# Patient Record
Sex: Female | Born: 1995 | Race: White | Hispanic: No | Marital: Single | Smoking: Never smoker
Health system: Southern US, Community
[De-identification: ages and names within clinical notes are randomized; demographics above are authoritative.]

## PROBLEM LIST (undated history)

## (undated) DIAGNOSIS — R011 Cardiac murmur, unspecified: Secondary | ICD-10-CM

## (undated) DIAGNOSIS — A64 Unspecified sexually transmitted disease: Secondary | ICD-10-CM

## (undated) DIAGNOSIS — D369 Benign neoplasm, unspecified site: Secondary | ICD-10-CM

## (undated) DIAGNOSIS — F419 Anxiety disorder, unspecified: Secondary | ICD-10-CM

## (undated) DIAGNOSIS — N946 Dysmenorrhea, unspecified: Secondary | ICD-10-CM

## (undated) DIAGNOSIS — E282 Polycystic ovarian syndrome: Secondary | ICD-10-CM

## (undated) HISTORY — DX: Anxiety disorder, unspecified: F41.9

## (undated) HISTORY — DX: Unspecified sexually transmitted disease: A64

## (undated) HISTORY — DX: Cardiac murmur, unspecified: R01.1

## (undated) HISTORY — DX: Polycystic ovarian syndrome: E28.2

## (undated) HISTORY — DX: Dysmenorrhea, unspecified: N94.6

## (undated) HISTORY — DX: Benign neoplasm, unspecified site: D36.9

---
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
## 2017-04-23 ENCOUNTER — Ambulatory Visit: Payer: Commercial Managed Care - PPO | Admitting: Obstetrics and Gynecology

## 2017-04-25 ENCOUNTER — Encounter: Payer: Self-pay | Admitting: Obstetrics and Gynecology

## 2017-04-25 ENCOUNTER — Ambulatory Visit (INDEPENDENT_AMBULATORY_CARE_PROVIDER_SITE_OTHER): Payer: Commercial Managed Care - PPO | Admitting: Obstetrics and Gynecology

## 2017-04-25 ENCOUNTER — Other Ambulatory Visit: Payer: Self-pay

## 2017-04-25 VITALS — BP 110/60 | HR 80 | Resp 14 | Ht 63.5 in | Wt 155.0 lb

## 2017-04-25 DIAGNOSIS — Z0000 Encounter for general adult medical examination without abnormal findings: Secondary | ICD-10-CM

## 2017-04-25 DIAGNOSIS — R7989 Other specified abnormal findings of blood chemistry: Secondary | ICD-10-CM

## 2017-04-25 DIAGNOSIS — Z113 Encounter for screening for infections with a predominantly sexual mode of transmission: Secondary | ICD-10-CM

## 2017-04-25 DIAGNOSIS — N926 Irregular menstruation, unspecified: Secondary | ICD-10-CM

## 2017-04-25 DIAGNOSIS — Z01419 Encounter for gynecological examination (general) (routine) without abnormal findings: Secondary | ICD-10-CM

## 2017-04-25 DIAGNOSIS — R945 Abnormal results of liver function studies: Secondary | ICD-10-CM | POA: Diagnosis not present

## 2017-04-25 LAB — POCT URINE PREGNANCY: PREG TEST UR: NEGATIVE

## 2017-04-25 NOTE — Patient Instructions (Addendum)
Call if you don't get your cycle in the next few weeks  EXERCISE AND DIET:  We recommended that you start or continue a regular exercise program for good health. Regular exercise means any activity that makes your heart beat faster and makes you sweat.  We recommend exercising at least 30 minutes per day at least 3 days a week, preferably 4 or 5.  We also recommend a diet low in fat and sugar.  Inactivity, poor dietary choices and obesity can cause diabetes, heart attack, stroke, and kidney damage, among others.    ALCOHOL AND SMOKING:  Women should limit their alcohol intake to no more than 7 drinks/beers/glasses of wine (combined, not each!) per week. Moderation of alcohol intake to this level decreases your risk of breast cancer and liver damage. And of course, no recreational drugs are part of a healthy lifestyle.  And absolutely no smoking or even second hand smoke. Most people know smoking can cause heart and lung diseases, but did you know it also contributes to weakening of your bones? Aging of your skin?  Yellowing of your teeth and nails?  CALCIUM AND VITAMIN D:  Adequate intake of calcium and Vitamin D are recommended.  The recommendations for exact amounts of these supplements seem to change often, but generally speaking 600 mg of calcium (either carbonate or citrate) and 800 units of Vitamin D per day seems prudent. Certain women may benefit from higher intake of Vitamin D.  If you are among these women, your doctor will have told you during your visit.    PAP SMEARS:  Pap smears, to check for cervical cancer or precancers,  have traditionally been done yearly, although recent scientific advances have shown that most women can have pap smears less often.  However, every woman still should have a physical exam from her gynecologist every year. It will include a breast check, inspection of the vulva and vagina to check for abnormal growths or skin changes, a visual exam of the cervix, and then an  exam to evaluate the size and shape of the uterus and ovaries.  And after 20 years of age, a rectal exam is indicated to check for rectal cancers. We will also provide age appropriate advice regarding health maintenance, like when you should have certain vaccines, screening for sexually transmitted diseases, bone density testing, colonoscopy, mammograms, etc.   MAMMOGRAMS:  All women over 46 years old should have a yearly mammogram. Many facilities now offer a "3D" mammogram, which may cost around $50 extra out of pocket. If possible,  we recommend you accept the option to have the 3D mammogram performed.  It both reduces the number of women who will be called back for extra views which then turn out to be normal, and it is better than the routine mammogram at detecting truly abnormal areas.    COLONOSCOPY:  Colonoscopy to screen for colon cancer is recommended for all women at age 86.  We know, you hate the idea of the prep.  We agree, BUT, having colon cancer and not knowing it is worse!!  Colon cancer so often starts as a polyp that can be seen and removed at colonscopy, which can quite literally save your life!  And if your first colonoscopy is normal and you have no family history of colon cancer, most women don't have to have it again for 10 years.  Once every ten years, you can do something that may end up saving your life, right?  We  will be happy to help you get it scheduled when you are ready.  Be sure to check your insurance coverage so you understand how much it will cost.  It may be covered as a preventative service at no cost, but you should check your particular policy.      Breast Self-Awareness Breast self-awareness means being familiar with how your breasts look and feel. It involves checking your breasts regularly and reporting any changes to your health care provider. Practicing breast self-awareness is important. A change in your breasts can be a sign of a serious medical problem.  Being familiar with how your breasts look and feel allows you to find any problems early, when treatment is more likely to be successful. All women should practice breast self-awareness, including women who have had breast implants. How to do a breast self-exam One way to learn what is normal for your breasts and whether your breasts are changing is to do a breast self-exam. To do a breast self-exam: Look for Changes  1. Remove all the clothing above your waist. 2. Stand in front of a mirror in a room with good lighting. 3. Put your hands on your hips. 4. Push your hands firmly downward. 5. Compare your breasts in the mirror. Look for differences between them (asymmetry), such as: ? Differences in shape. ? Differences in size. ? Puckers, dips, and bumps in one breast and not the other. 6. Look at each breast for changes in your skin, such as: ? Redness. ? Scaly areas. 7. Look for changes in your nipples, such as: ? Discharge. ? Bleeding. ? Dimpling. ? Redness. ? A change in position. Feel for Changes  Carefully feel your breasts for lumps and changes. It is best to do this while lying on your back on the floor and again while sitting or standing in the shower or tub with soapy water on your skin. Feel each breast in the following way:  Place the arm on the side of the breast you are examining above your head.  Feel your breast with the other hand.  Start in the nipple area and make  inch (2 cm) overlapping circles to feel your breast. Use the pads of your three middle fingers to do this. Apply light pressure, then medium pressure, then firm pressure. The light pressure will allow you to feel the tissue closest to the skin. The medium pressure will allow you to feel the tissue that is a little deeper. The firm pressure will allow you to feel the tissue close to the ribs.  Continue the overlapping circles, moving downward over the breast until you feel your ribs below your  breast.  Move one finger-width toward the center of the body. Continue to use the  inch (2 cm) overlapping circles to feel your breast as you move slowly up toward your collarbone.  Continue the up and down exam using all three pressures until you reach your armpit.  Write Down What You Find  Write down what is normal for each breast and any changes that you find. Keep a written record with breast changes or normal findings for each breast. By writing this information down, you do not need to depend only on memory for size, tenderness, or location. Write down where you are in your menstrual cycle, if you are still menstruating. If you are having trouble noticing differences in your breasts, do not get discouraged. With time you will become more familiar with the variations in your breasts  and more comfortable with the exam. How often should I examine my breasts? Examine your breasts every month. If you are breastfeeding, the best time to examine your breasts is after a feeding or after using a breast pump. If you menstruate, the best time to examine your breasts is 5-7 days after your period is over. During your period, your breasts are lumpier, and it may be more difficult to notice changes. When should I see my health care provider? See your health care provider if you notice:  A change in shape or size of your breasts or nipples.  A change in the skin of your breast or nipples, such as a reddened or scaly area.  Unusual discharge from your nipples.  A lump or thick area that was not there before.  Pain in your breasts.  Anything that concerns you.  This information is not intended to replace advice given to you by your health care provider. Make sure you discuss any questions you have with your health care provider. Document Released: 12/19/2004 Document Revised: 05/27/2015 Document Reviewed: 11/08/2014 Elsevier Interactive Patient Education  [employer].

## 2017-04-25 NOTE — Progress Notes (Signed)
22 y.o. G1P0010 SingleAfrican AmericanF here for annual exam.  She stopped OCP's last October, just got tired of taking the pill. She is sexually active, same partner x 3 years. Living together.  Sometimes uses condoms. No dyspareunia.  Period Cycle (Days): 28 Period Duration (Days): 2-3 days  Period Pattern: Regular Menstrual Flow: Light Menstrual Control: Thin pad Menstrual Control Change Freq (Hours): changes pad every 1-2 hours  Dysmenorrhea: None  Pad isn't full when she changes it. She had an SAB in 2017.   Patient's last menstrual period was 03/24/2017.          Sexually active: Yes.    The current method of family planning is none.    Exercising: No.  The patient does not participate in regular exercise at present. Smoker:  no  Health Maintenance: Pap:  N/A TDaP: unsure, she will check with her Mom.  Gardasil: completed all 3    reports that she has never smoked. She has never used smokeless tobacco. She reports that she does not drink alcohol or use drugs. 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 Graduates next year (15 month program). 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   Past Medical History:  Diagnosis Date  . Dysmenorrhea   . Heart murmur     Past Surgical History:  Procedure Laterality Date  . WISDOM TOOTH EXTRACTION  10/2015    No current outpatient medications on file.   No current facility-administered medications for this visit.     Family History  Problem Relation Age of Onset  . Breast cancer Maternal Grandmother   . Heart disease Maternal Grandmother   . Heart disease Maternal Grandfather   . Diabetes Paternal Grandfather     Review of Systems  Constitutional: Negative.   HENT: Negative.   Eyes: Negative.   Respiratory: Negative.   Cardiovascular: Negative.   Gastrointestinal: Negative.   Endocrine: Negative.   Genitourinary: Negative.   Musculoskeletal: Negative.   Skin: Negative.   Allergic/Immunologic:  Negative.   Neurological: Positive for dizziness.  Psychiatric/Behavioral: Negative.   She saw her primary for her dizziness.   Exam:   BP 110/60 (BP Location: Right Arm, Patient Position: Sitting, Cuff Size: Normal)   Pulse 80   Resp 14   Ht 5' 3.5" (1.613 m)   Wt 155 lb (70.3 kg)   LMP 03/24/2017   BMI 27.03 kg/m   Weight change: @WEIGHTCHANGE @ Height:   Height: 5' 3.5" (161.3 cm)  Ht Readings from Last 3 Encounters:  04/25/17 5' 3.5" (1.613 m) (38 %, Z= -0.31)*  04/19/16 5' 2.5" (1.588 m) (25 %, Z= -0.69)*  02/01/16 5' 3.5" (1.613 m) (38 %, Z= -0.29)*   * Growth percentiles are based on CDC (Girls, 2-20 Years) data.    General appearance: alert, cooperative and appears stated age Head: Normocephalic, without obvious abnormality, atraumatic Neck: no adenopathy, supple, symmetrical, trachea midline and thyroid normal to inspection and palpation Lungs: clear to auscultation bilaterally Cardiovascular: regular rate and rhythm Breasts: normal appearance, no masses or tenderness Abdomen: soft, non-tender; non distended,  no masses,  no organomegaly Extremities: extremities normal, atraumatic, no cyanosis or edema Skin: Skin color, texture, turgor normal. No rashes or lesions Lymph nodes: Cervical, supraclavicular, and axillary nodes normal. No abnormal inguinal nodes palpated Neurologic: Grossly normal Pelvic deferred    A:  Well Woman with normal exam  Contraception, declines all options for contraception. States she will try to use condoms more often  Cycle is currently late (about 1 week), occasionally late  P:   No pap this year  UPT negative  TSH  Screening labs  No pap until she is 23  Screening STD  Recommended she start on a daily multivitamin with folic acid

## 2017-04-26 ENCOUNTER — Telehealth: Payer: Self-pay | Admitting: *Deleted

## 2017-04-26 LAB — COMPREHENSIVE METABOLIC PANEL
ALK PHOS: 81 IU/L (ref 39–117)
ALT: 23 IU/L (ref 0–32)
AST: 17 IU/L (ref 0–40)
Albumin/Globulin Ratio: 1.9 (ref 1.2–2.2)
Albumin: 4.7 g/dL (ref 3.5–5.5)
BILIRUBIN TOTAL: 0.4 mg/dL (ref 0.0–1.2)
BUN / CREAT RATIO: 14 (ref 9–23)
BUN: 9 mg/dL (ref 6–20)
CO2: 23 mmol/L (ref 20–29)
Calcium: 9.6 mg/dL (ref 8.7–10.2)
Chloride: 106 mmol/L (ref 96–106)
Creatinine, Ser: 0.66 mg/dL (ref 0.57–1.00)
GFR calc Af Amer: 148 mL/min/{1.73_m2} (ref >59)
GFR calc non Af Amer: 128 mL/min/{1.73_m2} (ref >59)
GLOBULIN, TOTAL: 2.5 g/dL (ref 1.5–4.5)
GLUCOSE: 90 mg/dL (ref 65–99)
Potassium: 4.2 mmol/L (ref 3.5–5.2)
SODIUM: 143 mmol/L (ref 134–144)
Total Protein: 7.2 g/dL (ref 6.0–8.5)

## 2017-04-26 LAB — CBC
HEMATOCRIT: 40.7 % (ref 34.0–46.6)
HEMOGLOBIN: 13.7 g/dL (ref 11.1–15.9)
MCH: 30.7 pg (ref 26.6–33.0)
MCHC: 33.7 g/dL (ref 31.5–35.7)
MCV: 91 fL (ref 79–97)
Platelets: 285 10*3/uL (ref 150–379)
RBC: 4.46 x10E6/uL (ref 3.77–5.28)
RDW: 13.1 % (ref 12.3–15.4)
WBC: 7.8 10*3/uL (ref 3.4–10.8)

## 2017-04-26 LAB — LIPID PANEL
CHOLESTEROL TOTAL: 138 mg/dL (ref 100–169)
Chol/HDL Ratio: 2.5 ratio (ref 0.0–4.4)
HDL: 55 mg/dL (ref >39)
LDL Calculated: 62 mg/dL (ref 0–109)
Triglycerides: 103 mg/dL — ABNORMAL HIGH (ref 0–89)
VLDL Cholesterol Cal: 21 mg/dL (ref 5–40)

## 2017-04-26 LAB — HEP, RPR, HIV PANEL
HIV SCREEN 4TH GENERATION: NONREACTIVE
Hepatitis B Surface Ag: NEGATIVE
RPR: NONREACTIVE

## 2017-04-26 LAB — GC/CHLAMYDIA PROBE AMP
CHLAMYDIA, DNA PROBE: NEGATIVE
NEISSERIA GONORRHOEAE BY PCR: NEGATIVE

## 2017-04-26 LAB — TSH: TSH: 1.55 u[IU]/mL (ref 0.450–4.500)

## 2017-04-26 NOTE — Telephone Encounter (Signed)
Left message to call for lab results -eh

## 2017-04-26 NOTE — Telephone Encounter (Signed)
-----   Message from Salvadore Dom, MD sent at 04/26/2017 12:16 PM EDT ----- Her triglycerides were slightly elevated (this can happen if she ate prior to her visit). The rest of her lab work was normal. She should eat a healthy diet, exercise regularly and have fasting labs next year.

## 2017-05-11 NOTE — Telephone Encounter (Signed)
Patient's results released vis my chart-eh

## 2017-07-13 ENCOUNTER — Telehealth: Payer: Self-pay | Admitting: Obstetrics and Gynecology

## 2017-07-13 NOTE — Telephone Encounter (Signed)
Left message to call Yolanda Villa at [phone].  

## 2017-07-13 NOTE — Telephone Encounter (Signed)
Call to patient. Reports history of irregular cycles when not on OCP. Had spotting in May but last normal period was in March.  Interested in medication to have cycle and be sure nothing is wrong.  Office visit scheduled for Tuesday, 07-17-17.

## 2017-07-13 NOTE — Telephone Encounter (Signed)
Patient is returning a call to Jill. °

## 2017-07-13 NOTE — Telephone Encounter (Signed)
Patient stated that she stopped her birth control back in October 2018. She stopped having cycles in March 2019, and has not had one since. Stated that she is positive that she is not pregnant. Stated that she has had this issue before when stopping birth control.

## 2017-07-16 NOTE — Progress Notes (Signed)
GYNECOLOGY  VISIT   HPI: 22 y.o.   Single  African American  female   G1P0010 with Patient's last menstrual period was 03/29/2017 (approximate).   here for amenorrhea since March. Stopped OCP in Oct 2018 had normal menses  has not had a menses since March,23 2019.   2 days of spotting in May. She is having unprotected intercourse, no desire for contraception or pregnancy. No constipation, dry skin, hair loss or fatigue. No galactorrhea. Last intercourse 2 days ago.   GYNECOLOGIC HISTORY: Patient's last menstrual period was 03/29/2017 (approximate). Contraception:None Menopausal hormone therapy: None        OB History    Gravida  1   Para      Term      Preterm      AB  1   Living        SAB  1   TAB      Ectopic      Multiple      Live Births                 Patient Active Problem List   Diagnosis Date Noted  . DUB (dysfunctional uterine bleeding) 04/15/2014  . 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 Heart murmur     Past Medical History:  Diagnosis Date  . Dysmenorrhea   . Heart murmur     Past Surgical History:  Procedure Laterality Date  . WISDOM TOOTH EXTRACTION  10/2015    No current outpatient medications on file.   No current facility-administered medications for this visit.      ALLERGIES: Patient has no known allergies.  Family History  Problem Relation Age of Onset  . Breast cancer Maternal Grandmother   . Heart disease Maternal Grandmother   . Heart disease Maternal Grandfather   . Diabetes Paternal Grandfather     Social History   Socioeconomic History  . Marital status: Single    Spouse name: Not on file  . Number of children: Not on file  . Years of education: Not on file  . Highest education level: Not on file  Occupational History  . Not on file  Social Needs  . Financial resource strain: Not on file  . Food insecurity:    Worry: Not on file    Inability: Not on file  . Transportation needs:    Medical: Not on file     Non-medical: Not on file  Tobacco Use  . Smoking status: Never Smoker  . Smokeless tobacco: Never Used  Substance and Sexual Activity  . Alcohol use: No    Alcohol/week: 0.0 oz  . Drug use: No  . Sexual activity: Yes    Partners: Male    Birth control/protection: None  Lifestyle  . Physical activity:    Days per week: Not on file    Minutes per session: Not on file  . Stress: Not on file  Relationships  . Social connections:    Talks on phone: Not on file    Gets together: Not on file    Attends religious service: Not on file    Active member of club or organization: Not on file    Attends meetings of clubs or organizations: Not on file    Relationship status: Not on file  . Intimate partner violence:    Fear of current or ex partner: Not on file    Emotionally abused: Not on file    Physically abused: Not on file  Forced sexual activity: Not on file  Other Topics Concern  . Not on file  Social History Narrative  . Not on file    Review of Systems  Constitutional: Negative.   HENT: Negative.   Eyes: Negative.   Respiratory: Negative.   Cardiovascular: Negative.   Gastrointestinal: Negative.   Genitourinary:       Amenorrhea  Musculoskeletal: Negative.   Skin: Negative.   Neurological: Negative.   Endo/Heme/Allergies: Negative.   Psychiatric/Behavioral: Negative.     PHYSICAL EXAMINATION:    BP 117/78 (BP Location: Right Arm, Patient Position: Sitting)   Pulse 72   Wt 156 lb 12.8 oz (71.1 kg)   LMP 03/29/2017 (Approximate)   BMI 27.34 kg/m     General appearance: alert, cooperative and appears stated age Neck: no adenopathy, supple, symmetrical, trachea midline and thyroid normal to inspection and palpation Abdomen: soft, non-tender; non distended, no masses,  no organomegaly   ASSESSMENT Amenorrhea Declines contraception, discussed that she will likely get pregnant. Doesn't want to get pregnant, but doesn't want to use any protection     PLAN UPT negative BhcG, TSH, prolactin, FSH, estradiol  Use condoms for 2 weeks, then check a UPT if negative take provera x 5 day. Discussed the importance of not being pregnant Call with or without a cycle Discussed the importance of regular cycles  An After Visit Summary was printed and given to the patient.

## 2017-07-17 ENCOUNTER — Encounter: Payer: Self-pay | Admitting: Obstetrics and Gynecology

## 2017-07-17 ENCOUNTER — Ambulatory Visit (INDEPENDENT_AMBULATORY_CARE_PROVIDER_SITE_OTHER): Payer: Commercial Managed Care - PPO | Admitting: Obstetrics and Gynecology

## 2017-07-17 ENCOUNTER — Other Ambulatory Visit: Payer: Self-pay

## 2017-07-17 VITALS — BP 117/78 | HR 72 | Wt 156.8 lb

## 2017-07-17 DIAGNOSIS — N912 Amenorrhea, unspecified: Secondary | ICD-10-CM

## 2017-07-17 LAB — POCT URINE PREGNANCY: Preg Test, Ur: NEGATIVE

## 2017-07-17 MED ORDER — MEDROXYPROGESTERONE ACETATE 5 MG PO TABS: 5.0000 mg | ORAL_TABLET | Freq: Every day | ORAL | 0 refills | Status: DC

## 2017-07-17 NOTE — Patient Instructions (Signed)
Use condoms for 2 weeks, check a pregnancy test, if negative take the provera x 5 days. You should get a cycle in 1-2 weeks. Please call with or without a cycle.

## 2017-07-18 LAB — BETA HCG QUANT (REF LAB)

## 2017-07-18 LAB — FOLLICLE STIMULATING HORMONE: FSH: 6.8 m[IU]/mL

## 2017-07-18 LAB — TSH: TSH: 2.36 u[IU]/mL (ref 0.450–4.500)

## 2017-07-18 LAB — PROLACTIN: Prolactin: 12.2 ng/mL (ref 4.8–23.3)

## 2017-07-18 LAB — ESTRADIOL: Estradiol: 28.2 pg/mL

## 2017-07-19 ENCOUNTER — Encounter: Payer: Self-pay | Admitting: Obstetrics and Gynecology

## 2017-07-20 ENCOUNTER — Telehealth: Payer: Self-pay | Admitting: Obstetrics and Gynecology

## 2017-07-20 NOTE — Telephone Encounter (Signed)
Call to patient and advised result note from Dr. Talbert Nan not available at this time.  Advised patient she will be contacted with results as they are available. She verbalizes understanding.  Encounter closed.

## 2017-07-20 NOTE — Telephone Encounter (Signed)
Call to patient to advise that results are not available at this time. Advised we will call her when results available from Dr. Talbert Nan.

## 2017-07-20 NOTE — Telephone Encounter (Signed)
Patient called asking for her recent lab results. I informed patient that Dr.Jertson is not in the office today and that her nurse would call her once the results have been reviewed. Patient is aware she may not receive a call today.

## 2017-07-22 ENCOUNTER — Encounter: Payer: Self-pay | Admitting: Obstetrics and Gynecology

## 2017-07-23 NOTE — Telephone Encounter (Signed)
Patient sent mychart message to advise she started her cycle without provera. Message returned to patient and advised she does not need to take the provera and to call our office if does not have a cycle on her own next month.  Patient has read mychart results.   Routing to Dr. Talbert Nan and will close.

## 2017-07-23 NOTE — Telephone Encounter (Signed)
Patient sent mychart message to advise she started her cycle without provera. Message returned to patient and advised she does not need to take the provera and to call our office if does not have a cycle on her own next month.  Patient has read mychart results.

## 2017-07-23 NOTE — Telephone Encounter (Signed)
-----   Message from Salvadore Dom, MD sent at 07/21/2017  9:37 AM EDT ----- Results and any recommendations were sent via [place]. If we haven't heard from her w/in one month, please call and see if she had a w/d bleed from the provera.   Hi Cecillia, Your lab work was all normal. Please call the office within 2 weeks of finishing the provera to let me know if you had a cycle.  Sumner Boast

## 2017-07-23 NOTE — Telephone Encounter (Signed)
-----   Message -----  From: Darrick Meigs  Sent: 7/21/20191:30 AM EDT  To: Salvadore Dom, MD Subject: Non-Urgent Medical Question  Hi , so I know you had prescribed me the provera. But I haven't even taking it yet and my period started on it's own . Do you still recommend I take it ?

## 2017-07-27 ENCOUNTER — Telehealth: Payer: Self-pay | Admitting: Obstetrics & Gynecology

## 2017-07-30 NOTE — Telephone Encounter (Signed)
Erroneous encounter

## 2017-08-15 ENCOUNTER — Telehealth: Payer: Self-pay | Admitting: Obstetrics and Gynecology

## 2017-08-15 NOTE — Telephone Encounter (Signed)
Spoke with patient. Patient reports using a new soap on 8/9, did not dry off with towel after, used a dirty t-shirt. Reports "rash" in crease of skin at bikini line, used warm wash cloth and her soap later, "rash" resolved. Rash returned again on 8/13. Feels like "diaper rash". No itching or open skin. Has been keeping area clean and dry. Denies any vaginal d/c or odor.  Request OV. OV scheduled for 8/15 at 4pm with Dr. Talbert Nan.  Routing to provider for final review. Patient is agreeable to disposition. Will close encounter.

## 2017-08-15 NOTE — Telephone Encounter (Signed)
Patient has swelling and a rash in her bikini area. Patient stated that she used her cousin's soap on Friday (8/9). After using a warm washcloth and her regular soap, it went away. Last night (8/13) the rash and swelling came back. Patient compared area to "diaper rash."

## 2017-08-15 NOTE — Telephone Encounter (Signed)
Left message to call Ovella Manygoats at [phone].  

## 2017-08-16 ENCOUNTER — Ambulatory Visit (INDEPENDENT_AMBULATORY_CARE_PROVIDER_SITE_OTHER): Payer: Commercial Managed Care - PPO | Admitting: Obstetrics and Gynecology

## 2017-08-16 ENCOUNTER — Encounter: Payer: Self-pay | Admitting: Obstetrics and Gynecology

## 2017-08-16 ENCOUNTER — Other Ambulatory Visit: Payer: Self-pay

## 2017-08-16 VITALS — BP 120/68 | HR 84 | Ht 63.5 in | Wt 158.0 lb

## 2017-08-16 DIAGNOSIS — R238 Other skin changes: Secondary | ICD-10-CM | POA: Diagnosis not present

## 2017-08-16 NOTE — Progress Notes (Signed)
GYNECOLOGY  VISIT   HPI: 22 y.o.   Single  Caucasian  female   G1P0010 with Patient's last menstrual period was 08/03/2017.   here for rash near bikini line.  Better now. Symptoms started 6 days ago. Noticed a rash in the bikini area on both sides. She had been sweating prior to getting the rash. Currently improved.     GYNECOLOGIC HISTORY: Patient's last menstrual period was 08/03/2017. Contraception:none        OB History    Gravida  1   Para      Term      Preterm      AB  1   Living        SAB  1   TAB      Ectopic      Multiple      Live Births                 Patient Active Problem List   Diagnosis Date Noted  . DUB (dysfunctional uterine bleeding) 04/15/2014  . 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 Heart murmur     Past Medical History:  Diagnosis Date  . Dysmenorrhea   . Heart murmur     Past Surgical History:  Procedure Laterality Date  . WISDOM TOOTH EXTRACTION  10/2015    No current outpatient medications on file.   No current facility-administered medications for this visit.      ALLERGIES: Patient has no known allergies.  Family History  Problem Relation Age of Onset  . Breast cancer Maternal Grandmother   . Heart disease Maternal Grandmother   . Heart disease Maternal Grandfather   . Diabetes Paternal Grandfather     Social History   Socioeconomic History  . Marital status: Single    Spouse name: Not on file  . Number of children: Not on file  . Years of education: Not on file  . Highest education level: Not on file  Occupational History  . Not on file  Social Needs  . Financial resource strain: Not on file  . Food insecurity:    Worry: Not on file    Inability: Not on file  . Transportation needs:    Medical: Not on file    Non-medical: Not on file  Tobacco Use  . Smoking status: Never Smoker  . Smokeless tobacco: Never Used  Substance and Sexual Activity  . Alcohol use: No    Alcohol/week: 0.0 standard drinks   . Drug use: No  . Sexual activity: Yes    Partners: Male    Birth control/protection: None  Lifestyle  . Physical activity:    Days per week: Not on file    Minutes per session: Not on file  . Stress: Not on file  Relationships  . Social connections:    Talks on phone: Not on file    Gets together: Not on file    Attends religious service: Not on file    Active member of club or organization: Not on file    Attends meetings of clubs or organizations: Not on file    Relationship status: Not on file  . Intimate partner violence:    Fear of current or ex partner: Not on file    Emotionally abused: Not on file    Physically abused: Not on file    Forced sexual activity: Not on file  Other Topics Concern  . Not on file  Social History Narrative  . Not on file  Review of Systems  Constitutional: Negative.   HENT: Negative.   Eyes: Negative.   Respiratory: Negative.   Cardiovascular: Negative.   Gastrointestinal: Negative.   Genitourinary: Negative.   Musculoskeletal: Negative.   Skin: Positive for rash.  Neurological: Negative.   Endo/Heme/Allergies: Negative.   Psychiatric/Behavioral: Negative.   All other systems reviewed and are negative.   PHYSICAL EXAMINATION:    BP 120/68   Pulse 84   Ht 5' 3.5" (1.613 m)   Wt 158 lb (71.7 kg)   LMP 08/03/2017   BMI 27.55 kg/m     General appearance: alert, cooperative and appears stated age Skin: bilateral groin with mild erythema in the panty line    ASSESSMENT Skin irritation, likely from sweating, irritation from her underwear rubbing    PLAN Patient reassured Vulvar skin care reviewed Call with continued concerns   An After Visit Summary was printed and given to the patient.

## 2017-08-17 NOTE — Telephone Encounter (Signed)
Okay to close encounter?  °

## 2017-09-06 ENCOUNTER — Encounter: Payer: Self-pay | Admitting: Obstetrics and Gynecology

## 2017-09-06 ENCOUNTER — Other Ambulatory Visit: Payer: Self-pay

## 2017-09-06 ENCOUNTER — Telehealth: Payer: Self-pay | Admitting: Obstetrics and Gynecology

## 2017-09-06 ENCOUNTER — Ambulatory Visit (INDEPENDENT_AMBULATORY_CARE_PROVIDER_SITE_OTHER): Payer: Commercial Managed Care - PPO | Admitting: Obstetrics and Gynecology

## 2017-09-06 VITALS — BP 116/80 | HR 75 | Wt 158.0 lb

## 2017-09-06 DIAGNOSIS — B373 Candidiasis of vulva and vagina: Secondary | ICD-10-CM | POA: Diagnosis not present

## 2017-09-06 DIAGNOSIS — B9689 Other specified bacterial agents as the cause of diseases classified elsewhere: Secondary | ICD-10-CM

## 2017-09-06 DIAGNOSIS — N760 Acute vaginitis: Secondary | ICD-10-CM | POA: Diagnosis not present

## 2017-09-06 DIAGNOSIS — B3731 Acute candidiasis of vulva and vagina: Secondary | ICD-10-CM

## 2017-09-06 MED ORDER — FLUCONAZOLE 150 MG PO TABS: 150.0000 mg | ORAL_TABLET | Freq: Once | ORAL | 0 refills | Status: AC

## 2017-09-06 MED ORDER — METRONIDAZOLE 500 MG PO TABS: 500.0000 mg | ORAL_TABLET | Freq: Two times a day (BID) | ORAL | 0 refills | Status: DC

## 2017-09-06 NOTE — Progress Notes (Signed)
GYNECOLOGY  VISIT   HPI: 22 y.o.   Single  African American  female   [id number] with No LMP recorded.   here for vaginal irritation. She was having vaginal itching yesterday. It hurt to wipe. Last night she took an oatmeal bath. She noticed clumpy white stuff when she looked with a mirror, not sure if it's toilet paper. She is sexually active, same partner.   GYNECOLOGIC HISTORY: No LMP recorded. Contraception:none Menopausal hormone therapy: n/a        OB History    Gravida  1   Para      Term      Preterm      AB  1   Living        SAB  1   TAB      Ectopic      Multiple      Live Births                 Patient Active Problem List   Diagnosis Date Noted  . DUB (dysfunctional uterine bleeding) 04/15/2014  . 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 Heart murmur     Past Medical History:  Diagnosis Date  . Dysmenorrhea   . Heart murmur     Past Surgical History:  Procedure Laterality Date  . WISDOM TOOTH EXTRACTION  10/2015    No current outpatient medications on file.   No current facility-administered medications for this visit.      ALLERGIES: Patient has no known allergies.  Family History  Problem Relation Age of Onset  . Breast cancer Maternal Grandmother   . Heart disease Maternal Grandmother   . Heart disease Maternal Grandfather   . Diabetes Paternal Grandfather     Social History   Socioeconomic History  . Marital status: Single    Spouse name: Not on file  . Number of children: Not on file  . Years of education: Not on file  . Highest education level: Not on file  Occupational History  . Not on file  Social Needs  . Financial resource strain: Not on file  . Food insecurity:    Worry: Not on file    Inability: Not on file  . Transportation needs:    Medical: Not on file    Non-medical: Not on file  Tobacco Use  . Smoking status: Never Smoker  . Smokeless tobacco: Never Used  Substance and Sexual Activity  . Alcohol use:  No    Alcohol/week: 0.0 standard drinks  . Drug use: No  . Sexual activity: Yes    Partners: Male    Birth control/protection: None  Lifestyle  . Physical activity:    Days per week: Not on file    Minutes per session: Not on file  . Stress: Not on file  Relationships  . Social connections:    Talks on phone: Not on file    Gets together: Not on file    Attends religious service: Not on file    Active member of club or organization: Not on file    Attends meetings of clubs or organizations: Not on file    Relationship status: Not on file  . Intimate partner violence:    Fear of current or ex partner: Not on file    Emotionally abused: Not on file    Physically abused: Not on file    Forced sexual activity: Not on file  Other Topics Concern  . Not on file  Social History Narrative  .  Not on file    Review of Systems  Constitutional: Negative.   HENT: Negative.   Eyes: Negative.   Respiratory: Negative.   Cardiovascular: Negative.   Gastrointestinal: Negative.   Genitourinary:       Vaginal irritation  Musculoskeletal: Negative.   Skin: Negative.   Neurological: Negative.   Endo/Heme/Allergies: Negative.   Psychiatric/Behavioral: Negative.   All other systems reviewed and are negative.   PHYSICAL EXAMINATION:    There were no vitals taken for this visit.    General appearance: alert, cooperative and appears stated age   Pelvic: External genitalia:  no lesions              Urethra:  normal appearing urethra with no masses, tenderness or lesions              Bartholins and Skenes: normal                 Vagina: erythematous appearing vagina with an increase in watery vaginal discharge, slight increase in clumpy vaginal d/c              Cervix: no cervical motion tenderness, no lesions and + erythema              Bimanual Exam:  Uterus:  normal size, contour, position, consistency, mobility, non-tender              Adnexa: no mass, fullness, tenderness                 Chaperone was present for exam.  Wet prep: + clue, no trich, + wbc KOH: + yeast PH: 5   ASSESSMENT Yeast vaginitis BV    PLAN Treat with flagyl (no ETOH) and diflucan Call if symptoms don't resolve Recent negative STD testing (no concerns)   An After Visit Summary was printed and given to the patient.

## 2017-09-06 NOTE — Telephone Encounter (Signed)
Patient is asking to talk a nurse about a issue she is having. Patient states " when I went to the bathroom I notice some toilet paper stuck in my vagina". "I am not sure how to make sure that it all out"? "I do not want to have any issues if there is any toilet paper stuck inside".

## 2017-09-06 NOTE — Telephone Encounter (Signed)
Spoke with patient. Patient reports vaginal itching unresolved by oatmeal baths. Patient states she feels and did pulls some toilet paper out of her vaginal canal. Patient is unsure if any is left in or if it was all removed. Vaginal tissue is now irritated more. Denies vaginal d/c, odor or pain.   Recommended OV for further evaluation. OV scheduled for today at 1:45pm with Dr. Talbert Nan. Patient has additional questions for business office, call forwarded to [place] for assistance.   Encounter closed.

## 2017-09-06 NOTE — Patient Instructions (Signed)
Vaginal Yeast infection, Adult Vaginal yeast infection is a condition that causes soreness, swelling, and redness (inflammation) of the vagina. It also causes vaginal discharge. This is a common condition. Some women get this infection frequently. What are the causes? This condition is caused by a change in the normal balance of the yeast (candida) and bacteria that live in the vagina. This change causes an overgrowth of yeast, which causes the inflammation. What increases the risk? This condition is more likely to develop in:  Women who take antibiotic medicines.  Women who have diabetes.  Women who take birth control pills.  Women who are pregnant.  Women who douche often.  Women who have a weak defense (immune) system.  Women who have been taking steroid medicines for a long time.  Women who frequently wear tight clothing.  What are the signs or symptoms? Symptoms of this condition include:  White, thick vaginal discharge.  Swelling, itching, redness, and irritation of the vagina. The lips of the vagina (vulva) may be affected as well.  Pain or a burning feeling while urinating.  Pain during sex.  How is this diagnosed? This condition is diagnosed with a medical history and physical exam. This will include a pelvic exam. Your health care provider will examine a sample of your vaginal discharge under a microscope. Your health care provider may send this sample for testing to confirm the diagnosis. How is this treated? This condition is treated with medicine. Medicines may be over-the-counter or prescription. You may be told to use one or more of the following:  Medicine that is taken orally.  Medicine that is applied as a cream.  Medicine that is inserted directly into the vagina (suppository).  Follow these instructions at home:  Take or apply over-the-counter and prescription medicines only as told by your health care provider.  Do not have sex until your health  care provider has approved. Tell your sex partner that you have a yeast infection. That person should go to his or her health care provider if he or she develops symptoms.  Do not wear tight clothes, such as pantyhose or tight pants.  Avoid using tampons until your health care provider approves.  Eat more yogurt. This may help to keep your yeast infection from returning.  Try taking a sitz bath to help with discomfort. This is a warm water bath that is taken while you are sitting down. The water should only come up to your hips and should cover your buttocks. Do this 3-4 times per day or as told by your health care provider.  Do not douche.  Wear breathable, cotton underwear.  If you have diabetes, keep your blood sugar levels under control. Contact a health care provider if:  You have a fever.  Your symptoms go away and then return.  Your symptoms do not get better with treatment.  Your symptoms get worse.  You have new symptoms.  You develop blisters in or around your vagina.  You have blood coming from your vagina and it is not your menstrual period.  You develop pain in your abdomen. This information is not intended to replace advice given to you by your health care provider. Make sure you discuss any questions you have with your health care provider. Document Released: 09/28/2004 Document Revised: 06/02/2015 Document Reviewed: 06/22/2014 Elsevier Interactive Patient Education  2018 [employer]. Bacterial Vaginosis Bacterial vaginosis is a vaginal infection that occurs when the normal balance of bacteria in the vagina is disrupted.  It results from an overgrowth of certain bacteria. This is the most common vaginal infection among women ages 4-44. Because bacterial vaginosis increases your risk for STIs (sexually transmitted infections), getting treated can help reduce your risk for chlamydia, gonorrhea, herpes, and HIV (human immunodeficiency virus). Treatment is also  important for preventing complications in pregnant women, because this condition can cause an early (premature) delivery. What are the causes? This condition is caused by an increase in harmful bacteria that are normally present in small amounts in the vagina. However, the reason that the condition develops is not fully understood. What increases the risk? The following factors may make you more likely to develop this condition:  Having a new sexual partner or multiple sexual partners.  Having unprotected sex.  Douching.  Having an intrauterine device (IUD).  Smoking.  Drug and alcohol abuse.  Taking certain antibiotic medicines.  Being pregnant.  You cannot get bacterial vaginosis from toilet seats, bedding, swimming pools, or contact with objects around you. What are the signs or symptoms? Symptoms of this condition include:  Grey or white vaginal discharge. The discharge can also be watery or foamy.  A fish-like odor with discharge, especially after sexual intercourse or during menstruation.  Itching in and around the vagina.  Burning or pain with urination.  Some women with bacterial vaginosis have no signs or symptoms. How is this diagnosed? This condition is diagnosed based on:  Your medical history.  A physical exam of the vagina.  Testing a sample of vaginal fluid under a microscope to look for a large amount of bad bacteria or abnormal cells. Your health care provider may use a cotton swab or a small wooden spatula to collect the sample.  How is this treated? This condition is treated with antibiotics. These may be given as a pill, a vaginal cream, or a medicine that is put into the vagina (suppository). If the condition comes back after treatment, a second round of antibiotics may be needed. Follow these instructions at home: Medicines  Take over-the-counter and prescription medicines only as told by your health care provider.  Take or use your antibiotic  as told by your health care provider. Do not stop taking or using the antibiotic even if you start to feel better. General instructions  If you have a female sexual partner, tell her that you have a vaginal infection. She should see her health care provider and be treated if she has symptoms. If you have a female sexual partner, he does not need treatment.  During treatment: ? Avoid sexual activity until you finish treatment. ? Do not douche. ? Avoid alcohol as directed by your health care provider. ? Avoid breastfeeding as directed by your health care provider.  Drink enough water and fluids to keep your urine clear or pale yellow.  Keep the area around your vagina and rectum clean. ? Wash the area daily with warm water. ? Wipe yourself from front to back after using the toilet.  Keep all follow-up visits as told by your health care provider. This is important. How is this prevented?  Do not douche.  Wash the outside of your vagina with warm water only.  Use protection when having sex. This includes latex condoms and dental dams.  Limit how many sexual partners you have. To help prevent bacterial vaginosis, it is best to have sex with just one partner (monogamous).  Make sure you and your sexual partner are tested for STIs.  Wear cotton or cotton-lined  underwear.  Avoid wearing tight pants and pantyhose, especially during summer.  Limit the amount of alcohol that you drink.  Do not use any products that contain nicotine or tobacco, such as cigarettes and e-cigarettes. If you need help quitting, ask your health care provider.  Do not use illegal drugs. Where to find more information:  Centers for Disease Control and Prevention: AppraiserFraud.fi  American Sexual Health Association (ASHA): www.ashastd.org  [place].S. Department of Health and Financial controller, Office on Women's Health: DustingSprays.pl or SecuritiesCard.it Contact a  health care provider if:  Your symptoms do not improve, even after treatment.  You have more discharge or pain when urinating.  You have a fever.  You have pain in your abdomen.  You have pain during sex.  You have vaginal bleeding between periods. Summary  Bacterial vaginosis is a vaginal infection that occurs when the normal balance of bacteria in the vagina is disrupted.  Because bacterial vaginosis increases your risk for STIs (sexually transmitted infections), getting treated can help reduce your risk for chlamydia, gonorrhea, herpes, and HIV (human immunodeficiency virus). Treatment is also important for preventing complications in pregnant women, because the condition can cause an early (premature) delivery.  This condition is treated with antibiotic medicines. These may be given as a pill, a vaginal cream, or a medicine that is put into the vagina (suppository). This information is not intended to replace advice given to you by your health care provider. Make sure you discuss any questions you have with your health care provider. Document Released: 12/19/2004 Document Revised: 04/24/2016 Document Reviewed: 09/04/2015 Elsevier Interactive Patient Education  [employer].

## 2017-09-12 NOTE — Progress Notes (Signed)
GYNECOLOGY  VISIT   HPI: 22 y.o.   Single White or Caucasian Not Hispanic or Latino  female   G1P0010 with Patient's last menstrual period was 08/28/2017.   here for vaginal irritation. The patient was treated for BV and yeast last week. Symptoms are better, she just wants to make sure the vaginitis is gone. No itching, burning or irritation or abnormal d/c.    She would like STD testing, just to be safe. She asked her partner to get checked as well. Not using condoms. Sexually active with female partner, together x 3 years. Hasn't used contraception for 11 months.   GYNECOLOGIC HISTORY: Patient's last menstrual period was 08/28/2017. Contraception:none Menopausal hormone therapy: none        OB History    Gravida  1   Para      Term      Preterm      AB  1   Living        SAB  1   TAB      Ectopic      Multiple      Live Births                 Patient Active Problem List   Diagnosis Date Noted  . DUB (dysfunctional uterine bleeding) 04/15/2014  . 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 Heart murmur     Past Medical History:  Diagnosis Date  . Dysmenorrhea   . Heart murmur     Past Surgical History:  Procedure Laterality Date  . WISDOM TOOTH EXTRACTION  10/2015    No current outpatient medications on file.   No current facility-administered medications for this visit.      ALLERGIES: Patient has no known allergies.  Family History  Problem Relation Age of Onset  . Breast cancer Maternal Grandmother   . Heart disease Maternal Grandmother   . Heart disease Maternal Grandfather   . Diabetes Paternal Grandfather     Social History   Socioeconomic History  . Marital status: Single    Spouse name: Not on file  . Number of children: Not on file  . Years of education: Not on file  . Highest education level: Not on file  Occupational History  . Not on file  Social Needs  . Financial resource strain: Not on file  . Food insecurity:    Worry: Not  on file    Inability: Not on file  . Transportation needs:    Medical: Not on file    Non-medical: Not on file  Tobacco Use  . Smoking status: Never Smoker  . Smokeless tobacco: Never Used  Substance and Sexual Activity  . Alcohol use: No    Alcohol/week: 0.0 standard drinks  . Drug use: No  . Sexual activity: Yes    Partners: Male    Birth control/protection: None  Lifestyle  . Physical activity:    Days per week: Not on file    Minutes per session: Not on file  . Stress: Not on file  Relationships  . Social connections:    Talks on phone: Not on file    Gets together: Not on file    Attends religious service: Not on file    Active member of club or organization: Not on file    Attends meetings of clubs or organizations: Not on file    Relationship status: Not on file  . Intimate partner violence:    Fear of current or ex partner: Not  on file    Emotionally abused: Not on file    Physically abused: Not on file    Forced sexual activity: Not on file  Other Topics Concern  . Not on file  Social History Narrative  . Not on file    Review of Systems  Constitutional: Negative.   HENT: Negative.   Eyes: Negative.   Respiratory: Negative.   Cardiovascular: Negative.   Gastrointestinal: Negative.   Endocrine: Negative.   Genitourinary:       Vaginal irritaiton  Musculoskeletal: Negative.   Skin: Negative.   Allergic/Immunologic: Negative.   Neurological: Negative.   Hematological: Negative.   Psychiatric/Behavioral: Negative.   All other systems reviewed and are negative.   PHYSICAL EXAMINATION:    BP 120/80   Pulse 81   Ht 5' 3.5" (1.613 m)   Wt 157 lb (71.2 kg)   LMP 08/28/2017   BMI 27.38 kg/m     General appearance: alert, cooperative and appears stated age  Pelvic: External genitalia:  no lesions              Urethra:  normal appearing urethra with no masses, tenderness or lesions              Bartholins and Skenes: normal                 Vagina:  normal appearing vagina with slight increase in watery, white vaginal discharge              Cervix: no cervical motion tenderness and no lesions              Bimanual Exam:  Uterus:  normal size, contour, position, consistency, mobility, non-tender              Adnexa: no mass, fullness, tenderness               Chaperone was present for exam.  Wet prep: ++ clue, no trich, + wbc KOH: no yeast PH: 5   ASSESSMENT Vaginal discharge, just treated for BV and yeast. Vaginal slides still c/w BV Screening STD Sexually active, no contraception for 11 months    PLAN Flagyl x 1 week, no ETOH Will send affirm to check for trich Screening std Declines contraception, not trying to get pregnant, but not preventing.  Recommended she take PNV (she has them)   An After Visit Summary was printed and given to the patient.

## 2017-09-13 ENCOUNTER — Ambulatory Visit (INDEPENDENT_AMBULATORY_CARE_PROVIDER_SITE_OTHER): Payer: Commercial Managed Care - PPO | Admitting: Obstetrics and Gynecology

## 2017-09-13 ENCOUNTER — Other Ambulatory Visit: Payer: Self-pay

## 2017-09-13 ENCOUNTER — Encounter: Payer: Self-pay | Admitting: Obstetrics and Gynecology

## 2017-09-13 VITALS — BP 120/80 | HR 81 | Ht 63.5 in | Wt 157.0 lb

## 2017-09-13 DIAGNOSIS — B9689 Other specified bacterial agents as the cause of diseases classified elsewhere: Secondary | ICD-10-CM | POA: Diagnosis not present

## 2017-09-13 DIAGNOSIS — N898 Other specified noninflammatory disorders of vagina: Secondary | ICD-10-CM

## 2017-09-13 DIAGNOSIS — N760 Acute vaginitis: Secondary | ICD-10-CM

## 2017-09-13 DIAGNOSIS — Z113 Encounter for screening for infections with a predominantly sexual mode of transmission: Secondary | ICD-10-CM | POA: Diagnosis not present

## 2017-09-13 MED ORDER — METRONIDAZOLE 500 MG PO TABS: 500.0000 mg | ORAL_TABLET | Freq: Two times a day (BID) | ORAL | 0 refills | Status: DC

## 2017-09-14 LAB — VAGINITIS/VAGINOSIS, DNA PROBE
Candida Species: NEGATIVE
Gardnerella vaginalis: NEGATIVE
TRICHOMONAS VAG: NEGATIVE

## 2017-09-14 LAB — HEP, RPR, HIV PANEL
HEP B S AG: NEGATIVE
HIV SCREEN 4TH GENERATION: NONREACTIVE
RPR: NONREACTIVE

## 2017-09-14 LAB — GC/CHLAMYDIA PROBE AMP
CHLAMYDIA, DNA PROBE: NEGATIVE
NEISSERIA GONORRHOEAE BY PCR: NEGATIVE

## 2017-09-14 LAB — HEPATITIS C ANTIBODY

## 2017-09-20 ENCOUNTER — Telehealth: Payer: Self-pay | Admitting: Obstetrics and Gynecology

## 2017-09-20 NOTE — Telephone Encounter (Signed)
Called patient and left messages cancelling her appointment with Dr. Talbert Nan tomorrow for follow up due to the provider being out of the office. Patient will call back to reschedule.

## 2017-09-20 NOTE — Progress Notes (Deleted)
GYNECOLOGY  VISIT   HPI: 22 y.o.   Single White or Caucasian Not Hispanic or Latino  female   G1P0010 with Patient's last menstrual period was 08/28/2017.   here for   Follow up   GYNECOLOGIC HISTORY: Patient's last menstrual period was 08/28/2017. Contraception: none Menopausal hormone therapy: none        OB History    Gravida  1   Para      Term      Preterm      AB  1   Living        SAB  1   TAB      Ectopic      Multiple      Live Births                 Patient Active Problem List   Diagnosis Date Noted  . DUB (dysfunctional uterine bleeding) 04/15/2014  . 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 Heart murmur     Past Medical History:  Diagnosis Date  . Dysmenorrhea   . Heart murmur     Past Surgical History:  Procedure Laterality Date  . WISDOM TOOTH EXTRACTION  10/2015    Current Outpatient Medications  Medication Sig Dispense Refill  . metroNIDAZOLE (FLAGYL) 500 MG tablet Take 1 tablet (500 mg total) by mouth 2 (two) times daily. 14 tablet 0   No current facility-administered medications for this visit.      ALLERGIES: Patient has no known allergies.  Family History  Problem Relation Age of Onset  . Breast cancer Maternal Grandmother   . Heart disease Maternal Grandmother   . Heart disease Maternal Grandfather   . Diabetes Paternal Grandfather     Social History   Socioeconomic History  . Marital status: Single    Spouse name: Not on file  . Number of children: Not on file  . Years of education: Not on file  . Highest education level: Not on file  Occupational History  . Not on file  Social Needs  . Financial resource strain: Not on file  . Food insecurity:    Worry: Not on file    Inability: Not on file  . Transportation needs:    Medical: Not on file    Non-medical: Not on file  Tobacco Use  . Smoking status: Never Smoker  . Smokeless tobacco: Never Used  Substance and Sexual Activity  . Alcohol use: No   Alcohol/week: 0.0 standard drinks  . Drug use: No  . Sexual activity: Yes    Partners: Male    Birth control/protection: None  Lifestyle  . Physical activity:    Days per week: Not on file    Minutes per session: Not on file  . Stress: Not on file  Relationships  . Social connections:    Talks on phone: Not on file    Gets together: Not on file    Attends religious service: Not on file    Active member of club or organization: Not on file    Attends meetings of clubs or organizations: Not on file    Relationship status: Not on file  . Intimate partner violence:    Fear of current or ex partner: Not on file    Emotionally abused: Not on file    Physically abused: Not on file    Forced sexual activity: Not on file  Other Topics Concern  . Not on file  Social History Narrative  . Not on file  Review of Systems  Constitutional: Negative.   HENT: Negative.   Eyes: Negative.   Respiratory: Negative.   Cardiovascular: Negative.   Gastrointestinal: Negative.   Endocrine: Negative.   Genitourinary: Negative.   Musculoskeletal: Negative.   Skin: Negative.   Allergic/Immunologic: Negative.   Neurological: Negative.   Hematological: Negative.   Psychiatric/Behavioral: Negative.     PHYSICAL EXAMINATION:    LMP 08/28/2017     General appearance: alert, cooperative and appears stated age Neck: no adenopathy, supple, symmetrical, trachea midline and thyroid {CHL AMB PHY EX THYROID NORM [phone]::"normal to inspection and palpation"} Breasts: {Exam; breast:13139::"normal appearance, no masses or tenderness"} Abdomen: soft, non-tender; non distended, no masses,  no organomegaly  Pelvic: External genitalia:  no lesions              Urethra:  normal appearing urethra with no masses, tenderness or lesions              Bartholins and Skenes: normal                 Vagina: normal appearing vagina with normal color and discharge, no lesions              Cervix: {CHL AMB  PHY EX CERVIX NORM DEFAULT:[phone]::"no lesions"}              Bimanual Exam:  Uterus:  {CHL AMB PHY EX UTERUS NORM DEFAULT:[phone]::"normal size, contour, position, consistency, mobility, non-tender"}              Adnexa: {CHL AMB PHY EX ADNEXA NO MASS DEFAULT:[phone]::"no mass, fullness, tenderness"}              Rectovaginal: {yes no:[id number]}.  Confirms.              Anus:  normal sphincter tone, no lesions  Chaperone was present for exam.  ASSESSMENT     PLAN    An After Visit Summary was printed and given to the patient.  *** minutes face to face time of which over 50% was spent in counseling.

## 2017-09-21 ENCOUNTER — Ambulatory Visit: Payer: Commercial Managed Care - PPO | Admitting: Obstetrics and Gynecology

## 2017-09-24 NOTE — Progress Notes (Signed)
GYNECOLOGY  VISIT   HPI: 22 y.o.   Single White or Caucasian Not Hispanic or Latino  female   G1P0010 with Patient's last menstrual period was 08/28/2017.   here for vaginal discharge recheck.   She was treated for BV and yeast earlier this month. She presented on 09/13/17 for recheck of vaginitis, she was worried it wasn't gone. Vaginal slide + BV, but Affirm testing was negative. She still feels uncomfortable at the opening of her vagina. She has seen some watery white vaginal d/c, not a lot. No itching or odor. She and her partner have both had negative STD testing.  GYNECOLOGIC HISTORY: Patient's last menstrual period was 08/28/2017. Contraception:none Menopausal hormone therapy: none        OB History    Gravida  1   Para      Term      Preterm      AB  1   Living        SAB  1   TAB      Ectopic      Multiple      Live Births                 Patient Active Problem List   Diagnosis Date Noted  . DUB (dysfunctional uterine bleeding) 04/15/2014  . 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 Heart murmur     Past Medical History:  Diagnosis Date  . Dysmenorrhea   . Heart murmur     Past Surgical History:  Procedure Laterality Date  . WISDOM TOOTH EXTRACTION  10/2015    No current outpatient medications on file.   No current facility-administered medications for this visit.      ALLERGIES: Patient has no known allergies.  Family History  Problem Relation Age of Onset  . Breast cancer Maternal Grandmother   . Heart disease Maternal Grandmother   . Heart disease Maternal Grandfather   . Diabetes Paternal Grandfather     Social History   Socioeconomic History  . Marital status: Single    Spouse name: Not on file  . Number of children: Not on file  . Years of education: Not on file  . Highest education level: Not on file  Occupational History  . Not on file  Social Needs  . Financial resource strain: Not on file  . Food insecurity:    Worry:  Not on file    Inability: Not on file  . Transportation needs:    Medical: Not on file    Non-medical: Not on file  Tobacco Use  . Smoking status: Never Smoker  . Smokeless tobacco: Never Used  Substance and Sexual Activity  . Alcohol use: No    Alcohol/week: 0.0 standard drinks  . Drug use: No  . Sexual activity: Yes    Partners: Male    Birth control/protection: None  Lifestyle  . Physical activity:    Days per week: Not on file    Minutes per session: Not on file  . Stress: Not on file  Relationships  . Social connections:    Talks on phone: Not on file    Gets together: Not on file    Attends religious service: Not on file    Active member of club or organization: Not on file    Attends meetings of clubs or organizations: Not on file    Relationship status: Not on file  . Intimate partner violence:    Fear of current or ex partner: Not  on file    Emotionally abused: Not on file    Physically abused: Not on file    Forced sexual activity: Not on file  Other Topics Concern  . Not on file  Social History Narrative  . Not on file    Review of Systems  Constitutional: Negative.   HENT: Negative.   Eyes: Negative.   Respiratory: Negative.   Cardiovascular: Negative.   Gastrointestinal: Negative.   Endocrine: Negative.   Genitourinary: Positive for vaginal discharge.  Musculoskeletal: Negative.   Skin: Negative.   Allergic/Immunologic: Negative.   Neurological: Negative.   Hematological: Negative.   Psychiatric/Behavioral: Negative.   All other systems reviewed and are negative.   PHYSICAL EXAMINATION:    BP 120/70   Pulse 84   Wt 156 lb (70.8 kg)   LMP 08/28/2017   BMI 27.20 kg/m     General appearance: alert, cooperative and appears stated age  Pelvic: External genitalia:  no lesions              Urethra:  normal appearing urethra with no masses, tenderness or lesions              Bartholins and Skenes: normal                 Vagina: normal  appearing vagina with an increase in watery, white, frothy vaginal d/c              Cervix: no lesions                Chaperone was present for exam.  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  Over 15 minutes face to face time of which over 50% was spent in counseling.

## 2017-09-26 ENCOUNTER — Encounter: Payer: Self-pay | Admitting: Obstetrics and Gynecology

## 2017-09-26 ENCOUNTER — Ambulatory Visit (INDEPENDENT_AMBULATORY_CARE_PROVIDER_SITE_OTHER): Payer: Commercial Managed Care - PPO | Admitting: Obstetrics and Gynecology

## 2017-09-26 ENCOUNTER — Telehealth: Payer: Self-pay | Admitting: *Deleted

## 2017-09-26 ENCOUNTER — Other Ambulatory Visit: Payer: Self-pay

## 2017-09-26 VITALS — BP 120/70 | HR 84 | Wt 156.0 lb

## 2017-09-26 DIAGNOSIS — N760 Acute vaginitis: Secondary | ICD-10-CM | POA: Diagnosis not present

## 2017-09-26 DIAGNOSIS — B9689 Other specified bacterial agents as the cause of diseases classified elsewhere: Secondary | ICD-10-CM | POA: Diagnosis not present

## 2017-09-26 MED ORDER — NITROFURANTOIN MONOHYD MACRO 100 MG PO CAPS: 100.0000 mg | ORAL_CAPSULE | Freq: Two times a day (BID) | ORAL | 0 refills | Status: DC

## 2017-09-26 MED ORDER — PHENAZOPYRIDINE HCL 200 MG PO TABS: 200.0000 mg | ORAL_TABLET | Freq: Three times a day (TID) | ORAL | 0 refills | Status: DC | PRN

## 2017-09-26 MED ORDER — METRONIDAZOLE 500 MG PO TABS: 500.0000 mg | ORAL_TABLET | Freq: Two times a day (BID) | ORAL | 0 refills | Status: DC

## 2017-09-26 MED ORDER — NONFORMULARY OR COMPOUNDED ITEM: 0 refills | Status: DC

## 2017-09-26 NOTE — Telephone Encounter (Signed)
Reviewed with Dr. Nelson Chimes, Rx cancelled for [place] and pyridium. Call placed to [facility], spoke with Jenny Reichmann. Rx cancelled for macrobid and pyridium.   Call returned to patient, advised as seen above. Advised patient she will only have 2 Rx -flagyl and boric acid vag supp. Patient verbalizes understanding. Contact info provided for [facility].

## 2017-09-26 NOTE — Telephone Encounter (Signed)
Spoke with patient, advised per Dr. Nelson Chimes. Rx for Flagy and boric acid vaginal suppositories to [facility]. Advised patient to allow 48hrs for medication to be prepared. Instructed patient to return call to office to advise when she starts medication, will schedule f/u. Patient read back instructions, verbalizes understanding.    Treat with flagyl 500 mg BID x 7 days At the same time start Boric acid 600 mg vaginally x 21 days (stressed that this has to be used vaginally, is lethal if taken orally) F/u 1-2 days after finishing boric acid to document resolution  Signed, printed Rx for Boric acid faxed to [facility].

## 2017-09-26 NOTE — Telephone Encounter (Signed)
Patient is asking to talk with Dr.Jerton's nurse about her medication.

## 2017-09-26 NOTE — Telephone Encounter (Signed)
Spoke with patient. Patient states she was notified 2 RX were ready at [facility], calling to confirm pharmacy medications went to.   Advised patient Rx for Flagyl and boric acid vaginal supp went to [facility]. Advised I will review additional RX with Dr. Talbert Nan and return call.

## 2017-09-26 NOTE — Patient Instructions (Signed)
Bacterial Vaginosis Bacterial vaginosis is a vaginal infection that occurs when the normal balance of bacteria in the vagina is disrupted. It results from an overgrowth of certain bacteria. This is the most common vaginal infection among women ages 15-44. Because bacterial vaginosis increases your risk for STIs (sexually transmitted infections), getting treated can help reduce your risk for chlamydia, gonorrhea, herpes, and HIV (human immunodeficiency virus). Treatment is also important for preventing complications in pregnant women, because this condition can cause an early (premature) delivery. What are the causes? This condition is caused by an increase in harmful bacteria that are normally present in small amounts in the vagina. However, the reason that the condition develops is not fully understood. What increases the risk? The following factors may make you more likely to develop this condition:  Having a new sexual partner or multiple sexual partners.  Having unprotected sex.  Douching.  Having an intrauterine device (IUD).  Smoking.  Drug and alcohol abuse.  Taking certain antibiotic medicines.  Being pregnant.  You cannot get bacterial vaginosis from toilet seats, bedding, swimming pools, or contact with objects around you. What are the signs or symptoms? Symptoms of this condition include:  Grey or white vaginal discharge. The discharge can also be watery or foamy.  A fish-like odor with discharge, especially after sexual intercourse or during menstruation.  Itching in and around the vagina.  Burning or pain with urination.  Some women with bacterial vaginosis have no signs or symptoms. How is this diagnosed? This condition is diagnosed based on:  Your medical history.  A physical exam of the vagina.  Testing a sample of vaginal fluid under a microscope to look for a large amount of bad bacteria or abnormal cells. Your health care provider may use a cotton swab  or a small wooden spatula to collect the sample.  How is this treated? This condition is treated with antibiotics. These may be given as a pill, a vaginal cream, or a medicine that is put into the vagina (suppository). If the condition comes back after treatment, a second round of antibiotics may be needed. Follow these instructions at home: Medicines  Take over-the-counter and prescription medicines only as told by your health care provider.  Take or use your antibiotic as told by your health care provider. Do not stop taking or using the antibiotic even if you start to feel better. General instructions  If you have a female sexual partner, tell her that you have a vaginal infection. She should see her health care provider and be treated if she has symptoms. If you have a female sexual partner, he does not need treatment.  During treatment: ? Avoid sexual activity until you finish treatment. ? Do not douche. ? Avoid alcohol as directed by your health care provider. ? Avoid breastfeeding as directed by your health care provider.  Drink enough water and fluids to keep your urine clear or pale yellow.  Keep the area around your vagina and rectum clean. ? Wash the area daily with warm water. ? Wipe yourself from front to back after using the toilet.  Keep all follow-up visits as told by your health care provider. This is important. How is this prevented?  Do not douche.  Wash the outside of your vagina with warm water only.  Use protection when having sex. This includes latex condoms and dental dams.  Limit how many sexual partners you have. To help prevent bacterial vaginosis, it is best to have sex with just   one partner (monogamous).  Make sure you and your sexual partner are tested for STIs.  Wear cotton or cotton-lined underwear.  Avoid wearing tight pants and pantyhose, especially during summer.  Limit the amount of alcohol that you drink.  Do not use any products that  contain nicotine or tobacco, such as cigarettes and e-cigarettes. If you need help quitting, ask your health care provider.  Do not use illegal drugs. Where to find more information:  Centers for Disease Control and Prevention: www.cdc.gov/std  American Sexual Health Association (ASHA): www.ashastd.org  U.S. Department of Health and Human Services, Office on Women's Health: www.womenshealth.gov/ or https://www.womenshealth.gov/a-z-topics/bacterial-vaginosis Contact a health care provider if:  Your symptoms do not improve, even after treatment.  You have more discharge or pain when urinating.  You have a fever.  You have pain in your abdomen.  You have pain during sex.  You have vaginal bleeding between periods. Summary  Bacterial vaginosis is a vaginal infection that occurs when the normal balance of bacteria in the vagina is disrupted.  Because bacterial vaginosis increases your risk for STIs (sexually transmitted infections), getting treated can help reduce your risk for chlamydia, gonorrhea, herpes, and HIV (human immunodeficiency virus). Treatment is also important for preventing complications in pregnant women, because the condition can cause an early (premature) delivery.  This condition is treated with antibiotic medicines. These may be given as a pill, a vaginal cream, or a medicine that is put into the vagina (suppository). This information is not intended to replace advice given to you by your health care provider. Make sure you discuss any questions you have with your health care provider. Document Released: 12/19/2004 Document Revised: 04/24/2016 Document Reviewed: 09/04/2015 Elsevier Interactive Patient Education  2018 Elsevier Inc.  

## 2017-09-28 NOTE — Telephone Encounter (Signed)
The recommendation is to check her one to two days after finishing the medication. If she hasn't already started it, I would have her start it on Saturday and come in the Monday after she finishes it.

## 2017-09-28 NOTE — Telephone Encounter (Signed)
Patient returning call.

## 2017-09-28 NOTE — Telephone Encounter (Signed)
Spoke with patient. Patient picked up flagyl and boric acid vaginal suppositories, will start both medication today. Reviewed instructions. Will complete suppositories on Thursday 10/17. OV scheduled for Monday 10/21 at 4pm with Dr. Talbert Nan. Patient verbalizes understanding and is agreeable.   Routing to Dr. Talbert Nan to review. OK as scheduled? will be 4 days after completion of medication.

## 2017-10-01 NOTE — Telephone Encounter (Signed)
Left message to call Chris Narasimhan at [phone].  

## 2017-10-04 NOTE — Telephone Encounter (Signed)
Encounter closed

## 2017-10-04 NOTE — Telephone Encounter (Signed)
Patient is returning a call to McKenzie. She stated that she will not be available until 1:00pm.

## 2017-10-04 NOTE — Telephone Encounter (Signed)
No, just keep her scheduled appointment on 10/21

## 2017-10-04 NOTE — Telephone Encounter (Signed)
Spoke with patient. Patient states she started both medications on Friday, 9/27. Advised to keep OV as scheduled for 10/21, I will review with Dr. Talbert Nan and return call if needs to be rescheduled. Patient verbalizes understanding.   Dr. Talbert Nan - Please review. OK to keep as scheduled or schedule with covering provider on Friday, 10/18. Will use last suppository on 10/17.

## 2017-10-04 NOTE — Telephone Encounter (Signed)
Left message to call Maryem Shuffler, RN at [facility] [phone].   

## 2017-10-22 ENCOUNTER — Encounter: Payer: Self-pay | Admitting: Obstetrics and Gynecology

## 2017-10-22 ENCOUNTER — Other Ambulatory Visit: Payer: Self-pay

## 2017-10-22 ENCOUNTER — Ambulatory Visit (INDEPENDENT_AMBULATORY_CARE_PROVIDER_SITE_OTHER): Payer: Commercial Managed Care - PPO | Admitting: Obstetrics and Gynecology

## 2017-10-22 VITALS — BP 124/72 | HR 64 | Wt 156.2 lb

## 2017-10-22 DIAGNOSIS — N760 Acute vaginitis: Secondary | ICD-10-CM | POA: Diagnosis not present

## 2017-10-22 DIAGNOSIS — N898 Other specified noninflammatory disorders of vagina: Secondary | ICD-10-CM

## 2017-10-22 NOTE — Progress Notes (Signed)
GYNECOLOGY  VISIT   HPI: 22 y.o.   Single White or Caucasian Not Hispanic or Latino  female   G1P0010 with No LMP recorded.   here for follow up on BV.  She was treated with flagyl and boric acid. Completed Boric Acid 10/17. Symptoms have returned today. Vaginal discharge that is clear/white in color. No odor, no itching.  She is with the same partner, not using condoms.   GYNECOLOGIC HISTORY: No LMP recorded. Unsure of LMP date. Contraception: None Menopausal hormone therapy: None        OB History    Gravida  1   Para      Term      Preterm      AB  1   Living        SAB  1   TAB      Ectopic      Multiple      Live Births                 Patient Active Problem List   Diagnosis Date Noted  . DUB (dysfunctional uterine bleeding) 04/15/2014  . 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 Heart murmur     Past Medical History:  Diagnosis Date  . Dysmenorrhea   . Heart murmur     Past Surgical History:  Procedure Laterality Date  . WISDOM TOOTH EXTRACTION  10/2015    Current Outpatient Medications  Medication Sig Dispense Refill  . NONFORMULARY OR COMPOUNDED ITEM Boric acid 600 mg vaginal suppositories. Place one suppository vaginally qhs x 21 days (Patient not taking: Reported on 10/22/2017) 21 each 0   No current facility-administered medications for this visit.      ALLERGIES: Patient has no known allergies.  Family History  Problem Relation Age of Onset  . Breast cancer Maternal Grandmother   . Heart disease Maternal Grandmother   . Heart disease Maternal Grandfather   . Diabetes Paternal Grandfather     Social History   Socioeconomic History  . Marital status: Single    Spouse name: Not on file  . Number of children: Not on file  . Years of education: Not on file  . Highest education level: Not on file  Occupational History  . Not on file  Social Needs  . Financial resource strain: Not on file  . Food insecurity:    Worry: Not on file     Inability: Not on file  . Transportation needs:    Medical: Not on file    Non-medical: Not on file  Tobacco Use  . Smoking status: Never Smoker  . Smokeless tobacco: Never Used  Substance and Sexual Activity  . Alcohol use: No    Alcohol/week: 0.0 standard drinks  . Drug use: No  . Sexual activity: Yes    Partners: Male    Birth control/protection: None  Lifestyle  . Physical activity:    Days per week: Not on file    Minutes per session: Not on file  . Stress: Not on file  Relationships  . Social connections:    Talks on phone: Not on file    Gets together: Not on file    Attends religious service: Not on file    Active member of club or organization: Not on file    Attends meetings of clubs or organizations: Not on file    Relationship status: Not on file  . Intimate partner violence:    Fear of current or ex partner: Not  on file    Emotionally abused: Not on file    Physically abused: Not on file    Forced sexual activity: Not on file  Other Topics Concern  . Not on file  Social History Narrative  . Not on file    Review of Systems  Constitutional: Negative.   HENT: Negative.   Eyes: Negative.   Respiratory: Negative.   Cardiovascular: Negative.   Gastrointestinal: Negative.   Genitourinary:       Vaginal discharge  Musculoskeletal: Negative.   Skin: Negative.   Neurological: Negative.   Endo/Heme/Allergies: Negative.   Psychiatric/Behavioral: Negative.     PHYSICAL EXAMINATION:    BP 124/72 (BP Location: Right Arm, Patient Position: Sitting, Cuff Size: Normal)   Pulse 64   Wt 156 lb 3.2 oz (70.9 kg)   BMI 27.24 kg/m     General appearance: alert, cooperative and appears stated age   Pelvic: External genitalia:  no lesions              Urethra:  normal appearing urethra with no masses, tenderness or lesions              Bartholins and Skenes: normal                 Vagina: normal appearing vagina with normal color and a slight increase in  white, watery vaginal discharge, no lesions              Cervix: no lesions  Chaperone was present for exam.  Wet prep: ? clue, no trich, +++ wbc KOH: no yeast PH: 5.5   ASSESSMENT H/O recurrent BV, s/p treatment with flagyl and boric acid.    PLAN Vaginal discharge, slides not clear. Elevated pH, +++WBC, ?few clue. With the amount of WBC and the pH, concern for trich Send affirm, treatment depending on results If negative for BV will proceed with 2 x a week metrogel for 6 months Recommended condom use   An After Visit Summary was printed and given to the patient.

## 2017-10-23 ENCOUNTER — Telehealth: Payer: Self-pay | Admitting: Obstetrics and Gynecology

## 2017-10-23 ENCOUNTER — Telehealth: Payer: Self-pay | Admitting: Emergency Medicine

## 2017-10-23 LAB — VAGINITIS/VAGINOSIS, DNA PROBE
Candida Species: NEGATIVE
GARDNERELLA VAGINALIS: NEGATIVE
TRICHOMONAS VAG: NEGATIVE

## 2017-10-23 MED ORDER — METRONIDAZOLE 0.75 % VA GEL: 1.0000 | VAGINAL | 7 refills | Status: DC

## 2017-10-23 NOTE — Telephone Encounter (Signed)
-----   Message from Salvadore Dom, MD sent at 10/23/2017 11:49 AM EDT ----- Please inform the patient that her vaginitis testing was negative and start her on metrogel. 1 applicator vaginally 2 x a week for 6 months. She should start today.

## 2017-10-23 NOTE — Telephone Encounter (Signed)
Patient's mom, Georgie Chard (okay to share PHI per DPR), called requesting a call back from the nurse to discuss the patient's recent visits.

## 2017-10-23 NOTE — Telephone Encounter (Signed)
Call to patient. Asked if it was okay to discuss her results and plan of care with her mother, she called her mother on three way calling and confirmed it was okay to discuss her results, plan of care and her recent visits with Dr. Talbert Nan.  Mother of patient wants to know why she is started on new medications if testing was negative. Discussed recurrent bacterial vaginosis, treatment with boric acid tablets and negative follow up testing and then suppressive treatment with metrogel. Stressed importance of consistent use for suppression. Questions answered to the best of my ability for Mother of patient.  Advised to call back with any further questions.  Encounter closed.

## 2017-10-23 NOTE — Telephone Encounter (Signed)
Spoke with patient and she is given results.  Discussed treatment for recurrent bacterial vaginosis and suppressive therapy now recommended by Dr. Talbert Nan.  Verbalized understanding of instructions and Rx sent to patient choice of pharmacy.  Encounter closed.

## 2017-10-23 NOTE — Telephone Encounter (Signed)
    Patient calling for lab results 

## 2017-11-12 ENCOUNTER — Telehealth: Payer: Self-pay | Admitting: Obstetrics & Gynecology

## 2017-11-12 NOTE — Telephone Encounter (Signed)
Patient's mom Alwyn Ren is calling regarding her daughter's "recurring problem" DPR on file to talk with mom. Alwyn Ren is asking for daughter to see Dr.Miller.

## 2017-11-12 NOTE — Telephone Encounter (Signed)
Left message to call Asuzena Weis, RN at [facility] [phone].   

## 2017-11-13 NOTE — Telephone Encounter (Signed)
Spoke with patient. She states she was doing well and had no s/s of bacterial vaginosis until Sunday night when she developed a millky/watery/clumpy white vaginal discharge. Denies pain, denies fevers.. States she knows when her body feels like she is developing bacterial vaginosis and feels like she is not getting any answers.  Her Mother is also a patient here and wants her to see Dr. Sabra Heck.  Multiple appointments offered.  Patient also has an appointment at another GYN office as a New GYN in December.  Office visit with Dr. Sabra Heck scheduled per her and her Mother's request for second opinion. Advised not to use Metrogel the night before her appointment.  Pt agreeable.  Encounter to Dr. Sabra Heck and will close.

## 2017-11-13 NOTE — Telephone Encounter (Signed)
Message left to return call to [place] at [phone].  Can speak with any triage nurse at return call.

## 2017-11-13 NOTE — Telephone Encounter (Signed)
Patient is asking to talk with Dr.Jertson's nurse, no details given.

## 2017-11-15 ENCOUNTER — Other Ambulatory Visit: Payer: Self-pay

## 2017-11-15 ENCOUNTER — Encounter: Payer: Self-pay | Admitting: Obstetrics & Gynecology

## 2017-11-15 ENCOUNTER — Ambulatory Visit (INDEPENDENT_AMBULATORY_CARE_PROVIDER_SITE_OTHER): Payer: Commercial Managed Care - PPO | Admitting: Obstetrics & Gynecology

## 2017-11-15 VITALS — BP 94/62 | HR 76 | Resp 16 | Ht 63.5 in | Wt 158.6 lb

## 2017-11-15 DIAGNOSIS — Z0000 Encounter for general adult medical examination without abnormal findings: Secondary | ICD-10-CM | POA: Diagnosis not present

## 2017-11-15 DIAGNOSIS — N926 Irregular menstruation, unspecified: Secondary | ICD-10-CM

## 2017-11-15 DIAGNOSIS — N760 Acute vaginitis: Secondary | ICD-10-CM

## 2017-11-15 MED ORDER — NORETHIN-ETH ESTRAD-FE BIPHAS 1 MG-10 MCG / 10 MCG PO TABS: 1.0000 | ORAL_TABLET | Freq: Every day | ORAL | 1 refills | Status: DC

## 2017-11-15 NOTE — Progress Notes (Signed)
GYNECOLOGY  VISIT  CC:   Desires second opinion about vaginitis treatment.  HPI: 22 y.o. G79P0010 Single White or Caucasian female here for recurrent vag infections.  Has been seen four times (8/15, 9/12, and 9/25, and 10/21) for vaginitis complaints.  She is SA with one partner.  STD testing has been done.  He is away at school so not currently SA.  Has been diagnosed with recurrent BV and after most recent visit, was treated with oral treatment for one weeks followed by boric acid suppositories and then twice weekly metrogel.  Pt reports having pelvic exams with each visit which make her very uncomfortable.  She is with her mother today who is a long time patient of mine.  They desire a second opinion as pt reports she is still having intermittent discharge that is associated with itching and irritation as well as some mild odor.  She does NOT want a pelvic exam today, however.  Pt's mother wants to know if treatment is correct for recurrent BV (it is as is by Up To Date guidelines) and what alternatives there are (none that are based on as much evidence as her current regimen).  Denies pelvic pain or low back pain.  Denies urinary symptoms.  Also, she would like to consider something for contraception as her cycles are not normal.  Mother would like to know why cycles are abnormal.  Typical causes discussed.  She would like some additional evaluation.  They are both interested in pt starting an OCP at this point.  Risks discussed with pt in detail including DUB, DVT/PE, headache, nausea, increased BP.  Instruction sheet provided and reviewed with pt when to start and what to do if misses pill/pills.  GYNECOLOGIC HISTORY: Patient's last menstrual period was 10/30/2017. Contraception: none Menopausal hormone therapy: none  Patient Active Problem List   Diagnosis Date Noted  . DUB (dysfunctional uterine bleeding) 04/15/2014  . 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 Heart murmur     Past Medical  History:  Diagnosis Date  . Dysmenorrhea   . Heart murmur     Past Surgical History:  Procedure Laterality Date  . WISDOM TOOTH EXTRACTION  10/2015    MEDS:   Current Outpatient Medications on File Prior to Visit  Medication Sig Dispense Refill  . metroNIDAZOLE (METROGEL) 0.75 % vaginal gel Place 1 Applicatorful vaginally 2 (two) times a week. 70 g 7   No current facility-administered medications on file prior to visit.     ALLERGIES: Patient has no known allergies.  Family History  Problem Relation Age of Onset  . Breast cancer Maternal Grandmother   . Heart disease Maternal Grandmother   . Heart disease Maternal Grandfather   . Diabetes Paternal Grandfather     SH:  Single, non smoker  Review of Systems  All other systems reviewed and are negative.   PHYSICAL EXAMINATION:    BP 94/62 (BP Location: Right Arm, Patient Position: Sitting, Cuff Size: Normal)   Pulse 76   Resp 16   Ht 5' 3.5" (1.613 m)   Wt 158 lb 9.6 oz (71.9 kg)   LMP 10/30/2017   BMI 27.65 kg/m     General appearance: alert, cooperative and appears stated age Pt self obtained a vaginal swab today. No other exam was performed due to pt's desire  Assessment: Recurrent vaginitis, current symptoms c/w yeast vaginitis. Irregular menstrual cycles SA, desirous to start contraception  Plan: Nuswab obtained today.  Results will be called.  Starting [employer]  1/20 Fe.  Will plan recheck 3 months CBC and testosterone levels obtained today.   ~30 minutes spent with patient >50% of time was in face to face discussion of above.

## 2017-11-15 NOTE — Patient Instructions (Signed)
Femdophilus vaginal/urinary tract health

## 2017-11-16 ENCOUNTER — Other Ambulatory Visit: Payer: Self-pay | Admitting: Obstetrics & Gynecology

## 2017-11-16 LAB — VAGINITIS/VAGINOSIS, DNA PROBE
Candida Species: POSITIVE — AB
Gardnerella vaginalis: NEGATIVE
Trichomonas vaginosis: NEGATIVE

## 2017-11-16 MED ORDER — FLUCONAZOLE 150 MG PO TABS: 150.0000 mg | ORAL_TABLET | Freq: Once | ORAL | 0 refills | Status: DC

## 2017-11-16 NOTE — Progress Notes (Signed)
rx for diflucan 150mg  po x 1, repeat 72 hours sent to pharmacy on file.

## 2017-11-18 LAB — CBC WITH DIFFERENTIAL/PLATELET
BASOS ABS: 0 10*3/uL (ref 0.0–0.2)
Basos: 0 %
EOS (ABSOLUTE): 0.1 10*3/uL (ref 0.0–0.4)
Eos: 2 %
Hematocrit: 38.6 % (ref 34.0–46.6)
Hemoglobin: 13 g/dL (ref 11.1–15.9)
Immature Grans (Abs): 0 10*3/uL (ref 0.0–0.1)
Immature Granulocytes: 0 %
LYMPHS ABS: 1.6 10*3/uL (ref 0.7–3.1)
Lymphs: 23 %
MCH: 30.4 pg (ref 26.6–33.0)
MCHC: 33.7 g/dL (ref 31.5–35.7)
MCV: 90 fL (ref 79–97)
MONOS ABS: 0.4 10*3/uL (ref 0.1–0.9)
Monocytes: 6 %
NEUTROS PCT: 69 %
Neutrophils Absolute: 4.8 10*3/uL (ref 1.4–7.0)
PLATELETS: 316 10*3/uL (ref 150–450)
RBC: 4.28 x10E6/uL (ref 3.77–5.28)
RDW: 12.4 % (ref 12.3–15.4)
WBC: 6.9 10*3/uL (ref 3.4–10.8)

## 2017-11-18 LAB — TESTOSTERONE, TOTAL, LC/MS/MS: TESTOSTERONE, TOTAL: 43.1 ng/dL (ref 10.0–55.0)

## 2017-12-07 ENCOUNTER — Encounter: Payer: Commercial Managed Care - PPO | Admitting: Women's Health

## 2017-12-21 ENCOUNTER — Encounter: Payer: Self-pay | Admitting: Obstetrics & Gynecology

## 2017-12-21 ENCOUNTER — Ambulatory Visit (INDEPENDENT_AMBULATORY_CARE_PROVIDER_SITE_OTHER): Payer: Commercial Managed Care - PPO | Admitting: Obstetrics & Gynecology

## 2017-12-21 VITALS — BP 108/60 | HR 76 | Resp 16 | Ht 63.5 in | Wt 159.2 lb

## 2017-12-21 DIAGNOSIS — N760 Acute vaginitis: Secondary | ICD-10-CM | POA: Diagnosis not present

## 2017-12-21 MED ORDER — FLUCONAZOLE 150 MG PO TABS: 150.0000 mg | ORAL_TABLET | Freq: Once | ORAL | 0 refills | Status: AC

## 2017-12-21 NOTE — Progress Notes (Signed)
GYNECOLOGY  VISIT  CC:   Pain with intercourse   HPI: 22 y.o. G50P0010 Single White or Caucasian female here for vaginal discharge.  Affirm testing showed yeast.  Was treated with Diflucan and symptoms resolved.  She reports symptoms returned a few weeks later after being SA.  Thinks maybe being SA is the cause of all of her symptoms.  They are not using condoms.  She has shared with him her recent issues and he was testing last week for STDs.  This was negative.    She's been her 8/15, 9/12, 9/25, 10/21 and 11/12 for complaint of symptoms.  Today, she is having vaginal itching as well as discharge.  There is no odor per her hx.    She start [place] last visit and is not having any issues.  She is in her second pack.  Cycle lasted about five days.  No days were heavy.    GYNECOLOGIC HISTORY: Patient's last menstrual period was 12/02/2017. Contraception: OCP Menopausal hormone therapy: none  Patient Active Problem List   Diagnosis Date Noted  . DUB (dysfunctional uterine bleeding) 04/15/2014  . 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 Heart murmur     Past Medical History:  Diagnosis Date  . Dysmenorrhea   . Heart murmur     Past Surgical History:  Procedure Laterality Date  . WISDOM TOOTH EXTRACTION  10/2015    MEDS:   Current Outpatient Medications on File Prior to Visit  Medication Sig Dispense Refill  . Norethindrone-Ethinyl Estradiol-Fe Biphas (LO LOESTRIN FE) 1 MG-10 MCG / 10 MCG tablet Take 1 tablet by mouth daily. 3 Package 1   No current facility-administered medications on file prior to visit.     ALLERGIES: Patient has no known allergies.  Family History  Problem Relation Age of Onset  . Breast cancer Maternal Grandmother   . Heart disease Maternal Grandmother   . Heart disease Maternal Grandfather   . Diabetes Paternal Grandfather     SH:  Single, non smoker  Review of Systems  Genitourinary: Positive for dyspareunia.       Breast pain  Loss of sexual  interest   All other systems reviewed and are negative.   PHYSICAL EXAMINATION:    BP 108/60 (BP Location: Right Arm, Patient Position: Sitting, Cuff Size: Large)   Pulse 76   Resp 16   Ht 5' 3.5" (1.613 m)   Wt 159 lb 3.2 oz (72.2 kg)   LMP 12/02/2017   BMI 27.76 kg/m     General appearance: alert, cooperative and appears stated age Abdomen: soft, non-tender; bowel sounds normal; no masses,  no organomegaly Lymph:  no inguinal LAD noted  Pelvic: External genitalia:  no lesions              Urethra:  normal appearing urethra with no masses, tenderness or lesions              Bartholins and Skenes: normal                 Vagina: watery discharge noted without odor, no skin changes or lesions              Cervix: no lesions              Bimanual Exam:  Uterus:  normal size, contour, position, consistency, mobility, non-tender              Adnexa: no mass, fullness, tenderness  Anus:  normal sphincter tone, no lesions  Chaperone was present for exam.  Assessment: Recurrent vaginitis  Plan: D/w pt using condoms Nuswab testing obtained today for yeast, BV, and trich Diflucan 150mg  po x 1, repeat 72 hours sent to pharmacy HbA1C obtained today

## 2017-12-23 LAB — HEMOGLOBIN A1C
Est. average glucose Bld gHb Est-mCnc: 91 mg/dL
HEMOGLOBIN A1C: 4.8 % (ref 4.8–5.6)

## 2017-12-27 LAB — NUSWAB VAGINITIS (VG)
Candida albicans, NAA: POSITIVE — AB
Candida glabrata, NAA: NEGATIVE
TRICH VAG BY NAA: NEGATIVE

## 2017-12-28 ENCOUNTER — Telehealth: Payer: Self-pay | Admitting: Obstetrics & Gynecology

## 2017-12-28 NOTE — Telephone Encounter (Signed)
Patient is asking to talk with a nurse about the medication that Dr.Miller prescribed recently.

## 2017-12-28 NOTE — Telephone Encounter (Signed)
Return call to patient.  She wanted to let Dr. Sabra Heck know that she took the second Diflucan tablet today.  Symptoms are improved.  Advised patient to call back with any concerns or recurrent symptoms.  Encounter closed.

## 2018-01-07 ENCOUNTER — Telehealth: Payer: Self-pay | Admitting: Obstetrics & Gynecology

## 2018-01-07 NOTE — Telephone Encounter (Signed)
Patient called stating that she was told to call and schedule an ultrasound if her symptoms continued. Patient is still experiencing the same symptoms and would like to go ahead with scheduling the ultrasound.

## 2018-01-07 NOTE — Telephone Encounter (Signed)
Left message to call Kerolos Nehme, RN at [facility] [phone].   

## 2018-01-09 NOTE — Telephone Encounter (Signed)
Patient is returning call to Jill, RN.  °

## 2018-01-09 NOTE — Telephone Encounter (Signed)
Call placed to patient to review benefits for a scheduled ultrasound appointment. Left voicemail message requesting a return call

## 2018-01-09 NOTE — Telephone Encounter (Signed)
Spoke with patient. Patient requesting to schedule PUS with Dr. Sabra Heck to r/o PCOS. Patient seen in office on 12/21/17 and treated for yeast. Patient reports white vaginal d/c with odor has returned, has 1 diflucan that she will take today. Denies vaginal itching, pain, irregular vaginal bleeding, N/V, fever/chills. Patient expressed frustration with recurrent symptoms.   PUS scheduled for 01/10/18 at 3pm, consult to follow at 3:30pm with Dr. Sabra Heck. Advised I will need to review with Dr. Sabra Heck, will return call if any additional recommendations. Patient verbalizes understanding and is agreeable.   Dr. Sabra Heck -ok to proceed with PUS? If so, please advise on diagnosis?

## 2018-01-09 NOTE — Telephone Encounter (Signed)
Left message to call Kemoni Ortega, RN at [facility] [phone].   

## 2018-01-09 NOTE — Telephone Encounter (Signed)
Yolanda Villa reviewed benefits with patient, see account notes. Spoke with patient. Patient request to cancel PUS for now. Patient states she has taken diflucan today x1. Advised patient if symptoms do not resolve or new symptoms develop, return call to office to schedule OV with Dr. Sabra Heck. Patient verbalizes understanding. PUS cancelled.   Routing to provider for final review. Patient is agreeable to disposition. Will close encounter.  Cc: Lerry Liner

## 2018-01-10 ENCOUNTER — Other Ambulatory Visit: Payer: Self-pay | Admitting: Obstetrics & Gynecology

## 2018-01-10 ENCOUNTER — Other Ambulatory Visit: Payer: Self-pay

## 2018-01-23 ENCOUNTER — Encounter (HOSPITAL_COMMUNITY): Payer: Self-pay | Admitting: *Deleted

## 2018-01-23 ENCOUNTER — Other Ambulatory Visit: Payer: Self-pay

## 2018-01-23 ENCOUNTER — Emergency Department (HOSPITAL_COMMUNITY)
Admission: EM | Admit: 2018-01-23 | Discharge: 2018-01-23 | Disposition: A | Discharge status: Home / Self Care | Payer: Commercial Managed Care - PPO | Attending: Emergency Medicine | Admitting: Emergency Medicine

## 2018-01-23 DIAGNOSIS — N898 Other specified noninflammatory disorders of vagina: Secondary | ICD-10-CM | POA: Insufficient documentation

## 2018-01-23 DIAGNOSIS — Z79899 Other long term (current) drug therapy: Secondary | ICD-10-CM | POA: Insufficient documentation

## 2018-01-23 LAB — WET PREP, GENITAL
Clue Cells Wet Prep HPF POC: NONE SEEN
Sperm: NONE SEEN
TRICH WET PREP: NONE SEEN
Yeast Wet Prep HPF POC: NONE SEEN

## 2018-01-23 LAB — POC URINE PREG, ED: PREG TEST UR: NEGATIVE

## 2018-01-23 NOTE — Discharge Instructions (Addendum)
As discussed, your wet prep today is negative for either infection, no bacterial vaginosis or yeast infection.  Your gonorrhea and Chlamydia cultures are pending at this time.  Keep your appointment with Dr. Sabra Heck on Friday as you have scheduled, your cultures may be back by then.

## 2018-01-23 NOTE — ED Notes (Signed)
Patient reports history of BV and yeast. Was last seen and treated in [place]. Patient states she finished Monistat treatment on Sunday but is still having discharge. Reports she was supposed to have pelvic US but was unable to keep her appointment.

## 2018-01-23 NOTE — ED Triage Notes (Signed)
Pt c/o recurrent "creamy colored" vaginal discharge x months. Pt denies vaginal itching, burning, irritation. Pt reports she has been seeing her GYN in [place] and they have treated her for bacterial vaginosis and yeast infections. Pt reports they give her Diflucan for the yeast infections but it never clears up the discharge.

## 2018-01-24 LAB — GC/CHLAMYDIA PROBE AMP (~~LOC~~) NOT AT ARMC
CHLAMYDIA, DNA PROBE: NEGATIVE
NEISSERIA GONORRHEA: NEGATIVE

## 2018-01-24 NOTE — ED Provider Notes (Signed)
[facility] EMERGENCY DEPARTMENT Provider Note   CSN: [id number] Arrival date & time: 01/23/18  1129     History   Chief Complaint Chief Complaint  Patient presents with  . Vaginal Discharge    HPI Yolanda Villa is a 23 y.o. female.  The history is provided by the patient.  Vaginal Discharge  Quality:  Milky and thick Severity:  Moderate Duration:  3 months Progression:  Waxing and waning Chronicity:  Chronic Context: spontaneously   Relieved by:  Nothing (Patient has been treated by her gynecologist for both bacterial vaginosis and yeast vaginitis without improvement or prolonged resolution of symptoms.) Worsened by:  Nothing Associated symptoms: no abdominal pain, no dysuria, no fever, no genital lesions, no nausea, no rash, no urinary frequency, no vaginal itching and no vomiting   Associated symptoms comment:  Patient is sexually active in a long-term relationship.  Her partner has recently been screened for STDs and his tests were negative.  Patient has also been screened for gonorrhea and chlamydia by her gynecologist in September which was also negative.  She has not contacted her gynecologist to inform of failed treatment. Risk factors: unprotected sex   Risk factors: no gynecological surgery, no new sexual partner and no STI exposure   Risk factors comment:  She is on OCPs, does not use condoms.   Past Medical History:  Diagnosis Date  . Dysmenorrhea   . Heart murmur     Patient Active Problem List   Diagnosis Date Noted  . DUB (dysfunctional uterine bleeding) 04/15/2014  . 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 Heart murmur     Past Surgical History:  Procedure Laterality Date  . WISDOM TOOTH EXTRACTION  10/2015     OB History    Gravida  1   Para      Term      Preterm      AB  1   Living        SAB  1   TAB      Ectopic      Multiple      Live Births               Home Medications    Prior to Admission medications     Medication Sig Start Date End Date Taking? Authorizing Provider  Norethindrone-Ethinyl Estradiol-Fe Biphas (LO LOESTRIN FE) 1 MG-10 MCG / 10 MCG tablet Take 1 tablet by mouth daily. 11/15/17   Megan Salon, MD    Family History Family History  Problem Relation Age of Onset  . Breast cancer Maternal Grandmother   . Heart disease Maternal Grandmother   . Heart disease Maternal Grandfather   . Diabetes Paternal Grandfather     Social History Social History   Tobacco Use  . Smoking status: Never Smoker  . Smokeless tobacco: Never Used  Substance Use Topics  . Alcohol use: No    Alcohol/week: 0.0 standard drinks  . Drug use: No     Allergies   Patient has no known allergies.   Review of Systems Review of Systems  Constitutional: Negative for fever.  HENT: Negative for congestion and sore throat.   Eyes: Negative.   Respiratory: Negative for chest tightness and shortness of breath.   Cardiovascular: Negative for chest pain.  Gastrointestinal: Negative for abdominal pain, nausea and vomiting.  Genitourinary: Positive for vaginal discharge. Negative for dysuria, genital sores, hematuria, pelvic pain and vaginal pain.  Musculoskeletal: Negative for arthralgias, joint swelling and neck  pain.  Skin: Negative.  Negative for rash and wound.  Neurological: Negative for dizziness, weakness, light-headedness, numbness and headaches.  Psychiatric/Behavioral: Negative.      Physical Exam Updated Vital Signs BP 114/74 (BP Location: Right Arm)   Pulse 82   Temp (!) 97.4 F (36.3 C) (Oral) Comment: Drinking  Resp 20   Ht 5\' 3"  (1.6 m)   Wt 70.3 kg   LMP 12/28/2017   SpO2 100%   BMI 27.46 kg/m   Physical Exam Vitals signs and nursing note reviewed. Exam conducted with a chaperone present.  Constitutional:      Appearance: She is well-developed.  HENT:     Head: Normocephalic and atraumatic.  Eyes:     Conjunctiva/sclera: Conjunctivae normal.  Neck:      Musculoskeletal: Normal range of motion.  Cardiovascular:     Rate and Rhythm: Normal rate and regular rhythm.     Heart sounds: Normal heart sounds.  Pulmonary:     Effort: Pulmonary effort is normal.     Breath sounds: Normal breath sounds. No wheezing.  Abdominal:     General: Bowel sounds are normal.     Palpations: Abdomen is soft.     Tenderness: There is no abdominal tenderness. There is no right CVA tenderness or left CVA tenderness.  Genitourinary:    General: Normal vulva.     Labia:        Right: No rash.        Left: No rash.      Vagina: Vaginal discharge present.     Cervix: Normal.     Uterus: Normal. Not enlarged and not tender.      Adnexa: Right adnexa normal and left adnexa normal.     Comments: Thick white discharge in vaginal vault. Musculoskeletal: Normal range of motion.  Skin:    General: Skin is warm and dry.  Neurological:     Mental Status: She is alert.      ED Treatments / Results  Labs (all labs ordered are listed, but only abnormal results are displayed) Labs Reviewed  WET PREP, GENITAL - Abnormal; Notable for the following components:      Result Value   WBC, Wet Prep HPF POC MANY (*)    All other components within normal limits  POC URINE PREG, ED  GC/CHLAMYDIA PROBE AMP ([place]) NOT AT [facility]    EKG None  Radiology No results found.  Procedures Procedures (including critical care time)  Medications Ordered in ED Medications - No data to display   Initial Impression / Assessment and Plan / ED Course  I have reviewed the triage vital signs and the nursing notes.  Pertinent labs & imaging results that were available during my care of the patient were reviewed by me and considered in my medical decision making (see chart for details).     Labs reviewed and also discussed pending cultures.  Patient defers prophylactic treatment of GC chlamydia at this time.  She was encouraged to follow-up with her gynecologist who is the  best person to help treat this chronic condition.  Discussed increased WBCs in her vaginal discharge which could suggest inflammation and not infection.  Her wet prep was negative today.  Patient will call Dr. Sabra Heck to arrange an office follow-up.  Final Clinical Impressions(s) / ED Diagnoses   Final diagnoses:  Vaginal discharge    ED Discharge Orders    None       Evalee Jefferson, Hershal Coria 01/24/18 1322  Margette Fast, MD 01/24/18 [phone]

## 2018-01-25 ENCOUNTER — Other Ambulatory Visit: Payer: Self-pay

## 2018-01-25 ENCOUNTER — Ambulatory Visit (INDEPENDENT_AMBULATORY_CARE_PROVIDER_SITE_OTHER): Payer: Commercial Managed Care - PPO | Admitting: Obstetrics & Gynecology

## 2018-01-25 ENCOUNTER — Encounter: Payer: Self-pay | Admitting: Obstetrics & Gynecology

## 2018-01-25 VITALS — BP 98/60 | HR 76 | Resp 16 | Ht 63.0 in | Wt 159.4 lb

## 2018-01-25 DIAGNOSIS — N926 Irregular menstruation, unspecified: Secondary | ICD-10-CM

## 2018-01-25 DIAGNOSIS — N760 Acute vaginitis: Secondary | ICD-10-CM

## 2018-01-25 MED ORDER — NORETHIN-ETH ESTRAD-FE BIPHAS 1 MG-10 MCG / 10 MCG PO TABS: 1.0000 | ORAL_TABLET | Freq: Every day | ORAL | 3 refills | Status: DC

## 2018-01-25 NOTE — Progress Notes (Signed)
GYNECOLOGY  VISIT  CC:   OCP follow up  HPI: 23 y.o. G33P0010 Single White or Caucasian female here for OCP follow up.  Reports she is having regular cycles with her OCPs.  She's now been on the OCPs for about three months.  Bleeding occurs on placebo week like it should.  Flow lasts about four days and is light.  She does wear pads but is light enough that she mostly can use a panty liner.  Does not [place] clots.  Has minimal cramping.  Pt reports about a week ago, she started having vaginal odor.  She treated with a OTC monistat (7 day treatment).  She went to the ER on Wednesday.  She states she went to the ER to see if she could have an ultrasound as she thought she might have an ovarian cyst.  However, she was not having pain so PUS was not performed.  Vaginitis testing did not show yeast or BV but did show WBCs.  GC/Chl testing was negative.  She is still regularly SA with her boyfriend.  Almost all of her vagitis symptoms occur after being SA.  Condom use has been recommended to see if this will help but they are not using condoms.  Overall, vaginal symptoms are much better.    GYNECOLOGIC HISTORY: Patient's last menstrual period was 12/28/2017. Contraception: OCP Menopausal hormone therapy: none  Patient Active Problem List   Diagnosis Date Noted  . DUB (dysfunctional uterine bleeding) 04/15/2014  . 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 Heart murmur     Past Medical History:  Diagnosis Date  . Dysmenorrhea   . Heart murmur     Past Surgical History:  Procedure Laterality Date  . WISDOM TOOTH EXTRACTION  10/2015    MEDS:   Current Outpatient Medications on File Prior to Visit  Medication Sig Dispense Refill  . Norethindrone-Ethinyl Estradiol-Fe Biphas (LO LOESTRIN FE) 1 MG-10 MCG / 10 MCG tablet Take 1 tablet by mouth daily. 3 Package 1   No current facility-administered medications on file prior to visit.     ALLERGIES: Patient has no known allergies.  Family History   Problem Relation Age of Onset  . Breast cancer Maternal Grandmother   . Heart disease Maternal Grandmother   . Heart disease Maternal Grandfather   . Diabetes Paternal Grandfather     SH:  Single, non smoker  Review of Systems  All other systems reviewed and are negative.   PHYSICAL EXAMINATION:    BP 98/60 (BP Location: Right Arm, Patient Position: Sitting, Cuff Size: Large)   Pulse 76   Resp 16   Ht 5\' 3"  (1.6 m)   Wt 159 lb 6.4 oz (72.3 kg)   LMP 12/28/2017   BMI 28.24 kg/m     General appearance: alert, cooperative and appears stated age CV:  Regular rate and rhythm Lungs:  clear to auscultation, no wheezes, rales or rhonchi, symmetric air entry  Assessment: Irregular menstrual cycles that have normalized with OCPs Recurrent yeast vaginitis, esp after intercourse  Plan: Continue OCPs.  Rx for Loloestrin 1 po 1 day.  # 3 month supply/4RF. Pt to return for AEX 1 year or sooner with any new issues As she now knows her vaginal symptoms are almost always yeast vaginitis, she is going to continue to treat with Monistat OTC and will return if this does not resolve her symptoms.   ~15 minutes spent with patient >50% of time was in face to face discussion of above.

## 2018-03-07 ENCOUNTER — Telehealth: Payer: Self-pay | Admitting: *Deleted

## 2018-03-07 NOTE — Telephone Encounter (Signed)
Patient is having yeast symptoms and would like appointment with Dr Sabra Heck.

## 2018-03-07 NOTE — Telephone Encounter (Signed)
Spoke with patient, requesting OV for vaginal odor that started 2 days ago. Hx of yeast. Denies any other symptoms, has not tried anything OTC, request OV. OV scheduled for 3/6 at 0945 with Dr. Sabra Heck.   Routing to provider for final review. Patient is agreeable to disposition. Will close encounter.

## 2018-03-08 ENCOUNTER — Encounter: Payer: Self-pay | Admitting: Obstetrics & Gynecology

## 2018-03-08 ENCOUNTER — Ambulatory Visit (INDEPENDENT_AMBULATORY_CARE_PROVIDER_SITE_OTHER): Payer: Commercial Managed Care - PPO | Admitting: Obstetrics & Gynecology

## 2018-03-08 VITALS — BP 108/60 | HR 72 | Temp 98.3°F | Resp 14 | Wt 160.0 lb

## 2018-03-08 DIAGNOSIS — N912 Amenorrhea, unspecified: Secondary | ICD-10-CM | POA: Diagnosis not present

## 2018-03-08 DIAGNOSIS — N898 Other specified noninflammatory disorders of vagina: Secondary | ICD-10-CM

## 2018-03-08 LAB — POCT URINE PREGNANCY: Preg Test, Ur: NEGATIVE

## 2018-03-08 NOTE — Progress Notes (Signed)
GYNECOLOGY  VISIT  CC:   Vaginal discharge  HPI: 23 y.o. G59P0010 Single White or Caucasian female here for concerns of vaginal odor that she noted yesterday.  She did have whitish discharge yesterday.  She is SA and she has been tested for [facility], chlamydia and trich in September and in January.  Denies any vaginal itching.  She is on her OCPs and did have a two day cycle in January.  She did not have a cycle in February.  She admits she has not been taking her pill regularly the past couple of months due to concerns that she would cause a birth defect if she was on pills and she got pregnant.  D/w pt if she doesn't take her pills regularly and doesn't use condoms, she is likely to get pregnant.  She is not using condoms, at all.  [facility] and [facility] suggested she take Plan B but she did not do this.  Concerned about risks for pregnancies in the future with plan B.  All of her concerns today are from things she's "heard".    UPT is negative today.    GYNECOLOGIC HISTORY: Patient's last menstrual period was 01/31/2018. Contraception: OCPs but not taking regularly  Patient Active Problem List   Diagnosis Date Noted  . 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 Heart murmur     Past Medical History:  Diagnosis Date  . Dysmenorrhea   . Heart murmur     Past Surgical History:  Procedure Laterality Date  . WISDOM TOOTH EXTRACTION  10/2015    MEDS:   Current Outpatient Medications on File Prior to Visit  Medication Sig Dispense Refill  . Norethindrone-Ethinyl Estradiol-Fe Biphas (LO LOESTRIN FE) 1 MG-10 MCG / 10 MCG tablet Take 1 tablet by mouth daily. 3 Package 3   No current facility-administered medications on file prior to visit.     ALLERGIES: Patient has no known allergies.  Family History  Problem Relation Age of Onset  . Breast cancer Maternal Grandmother   . Heart disease Maternal Grandmother   . Heart disease Maternal Grandfather   . Diabetes  Paternal Grandfather     SH:  Single, non smoker  Review of Systems  Genitourinary: Positive for menstrual problem and vaginal discharge.  All other systems reviewed and are negative.   PHYSICAL EXAMINATION:    BP 108/60 (BP Location: Right Arm, Patient Position: Sitting, Cuff Size: Normal)   Pulse 72   Temp 98.3 F (36.8 C) (Oral)   Resp 14   Wt 160 lb (72.6 kg)   LMP 01/31/2018   BMI 28.34 kg/m     General appearance: alert, cooperative and appears stated age Abdomen: soft, non-tender; bowel sounds normal; no masses,  no organomegaly Lymph:  no inguinal LAD noted  Pelvic: External genitalia:  no lesions              Urethra:  normal appearing urethra with no masses, tenderness or lesions              Bartholins and Skenes: normal                 Vagina: normal appearing vagina, yellowish discharge without odor noted, no vaginal lesions              Cervix: no lesions              Bimanual Exam:  Uterus:  normal size, contour, position, consistency, mobility, non-tender  Adnexa: no mass, fullness, tenderness  Chaperone was present for exam.  Assessment: Vaginal discharge H/o yeast x 2 in last six months SA but not regularly taking OCPs Irregular cycle with no bleeding in Feb  Plan: Vaginitis testing obtained today Pt highly encouraged to restart OCPs and take them regularly. Safety of Plan B endorsed Incorrect information regarding OCPs, birth defects, Plan B all discussed today Pt is going to call my office at end of current OCP back if has no cycle   ~20 minutes spent with patient >50% of time was in face to face discussion of above.

## 2018-03-13 ENCOUNTER — Other Ambulatory Visit: Payer: Self-pay | Admitting: *Deleted

## 2018-03-13 LAB — NUSWAB BV AND CANDIDA, NAA
Candida albicans, NAA: NEGATIVE
Candida glabrata, NAA: NEGATIVE
Megasphaera 1: HIGH Score — AB

## 2018-03-13 MED ORDER — METRONIDAZOLE 500 MG PO TABS: 500.0000 mg | ORAL_TABLET | Freq: Two times a day (BID) | ORAL | 0 refills | Status: DC

## 2018-05-13 ENCOUNTER — Ambulatory Visit: Payer: Commercial Managed Care - PPO | Admitting: Obstetrics and Gynecology

## 2018-07-16 ENCOUNTER — Telehealth: Payer: Self-pay | Admitting: Obstetrics & Gynecology

## 2018-07-16 NOTE — Telephone Encounter (Signed)
Patient want to discuss vitamins.

## 2018-07-16 NOTE — Telephone Encounter (Signed)
Spoke with patient. Patient was seen in office on 03/08/2018 for amenorrhea. Reports she started taking OTC supplement to her cycles regulate and to conceive. Reports cycles have been regular since April 2020, has not been taking the supplement on a regular basis, has tried to conceive once. Patient would like to try again in the Fall 2020, would like to further discuss planning with Dr. Sabra Heck. OV scheduled for 8/18 at 4pm, declined OV on 7/28. Patient will continue to calendar menses, is aware to call with any concerns.   Routing to provider for final review. Patient is agreeable to disposition. Will close encounter.

## 2018-08-01 ENCOUNTER — Telehealth: Payer: Self-pay | Admitting: Obstetrics & Gynecology

## 2018-08-01 NOTE — Telephone Encounter (Signed)
Patient is calling regarding bleeding after intercourse.

## 2018-08-01 NOTE — Progress Notes (Deleted)
GYNECOLOGY  VISIT   HPI: 23 y.o.   Single White or Caucasian Not Hispanic or Latino  female   G1P0010 with No LMP recorded.   here for     GYNECOLOGIC HISTORY: No LMP recorded. Contraception:*** Menopausal hormone therapy: ***        OB History    Gravida  1   Para      Term      Preterm      AB  1   Living        SAB  1   TAB      Ectopic      Multiple      Live Births                 Patient Active Problem List   Diagnosis Date Noted  . Heart murmur     Past Medical History:  Diagnosis Date  . Dysmenorrhea   . Heart murmur     Past Surgical History:  Procedure Laterality Date  . WISDOM TOOTH EXTRACTION  10/2015    Current Outpatient Medications  Medication Sig Dispense Refill  . metroNIDAZOLE (FLAGYL) 500 MG tablet Take 1 tablet (500 mg total) by mouth 2 (two) times daily. 14 tablet 0  . Norethindrone-Ethinyl Estradiol-Fe Biphas (LO LOESTRIN FE) 1 MG-10 MCG / 10 MCG tablet Take 1 tablet by mouth daily. 3 Package 3   No current facility-administered medications for this visit.      ALLERGIES: Patient has no known allergies.  Family History  Problem Relation Age of Onset  . Breast cancer Maternal Grandmother   . Heart disease Maternal Grandmother   . Heart disease Maternal Grandfather   . Diabetes Paternal Grandfather     Social History   Socioeconomic History  . Marital status: Single    Spouse name: Not on file  . Number of children: Not on file  . Years of education: Not on file  . Highest education level: Not on file  Occupational History  . Not on file  Social Needs  . Financial resource strain: Not on file  . Food insecurity    Worry: Not on file    Inability: Not on file  . Transportation needs    Medical: Not on file    Non-medical: Not on file  Tobacco Use  . Smoking status: Never Smoker  . Smokeless tobacco: Never Used  Substance and Sexual Activity  . Alcohol use: No    Alcohol/week: 0.0 standard drinks  .  Drug use: No  . Sexual activity: Yes    Partners: Male    Birth control/protection: None  Lifestyle  . Physical activity    Days per week: Not on file    Minutes per session: Not on file  . Stress: Not on file  Relationships  . Social Herbalist on phone: Not on file    Gets together: Not on file    Attends religious service: Not on file    Active member of club or organization: Not on file    Attends meetings of clubs or organizations: Not on file    Relationship status: Not on file  . Intimate partner violence    Fear of current or ex partner: Not on file    Emotionally abused: Not on file    Physically abused: Not on file    Forced sexual activity: Not on file  Other Topics Concern  . Not on file  Social History Narrative  .  Not on file    ROS  PHYSICAL EXAMINATION:    There were no vitals taken for this visit.    General appearance: alert, cooperative and appears stated age Neck: no adenopathy, supple, symmetrical, trachea midline and thyroid {CHL AMB PHY EX THYROID NORM DEFAULT:[phone]::"normal to inspection and palpation"} Breasts: {Exam; breast:13139::"normal appearance, no masses or tenderness"} Abdomen: soft, non-tender; non distended, no masses,  no organomegaly  Pelvic: External genitalia:  no lesions              Urethra:  normal appearing urethra with no masses, tenderness or lesions              Bartholins and Skenes: normal                 Vagina: normal appearing vagina with normal color and discharge, no lesions              Cervix: {CHL AMB PHY EX CERVIX NORM [phone]::"no lesions"}              Bimanual Exam:  Uterus:  {CHL AMB PHY EX UTERUS NORM [phone]::"normal size, contour, position, consistency, mobility, non-tender"}              Adnexa: {CHL AMB PHY EX ADNEXA NO MASS DEFAULT:[phone]::"no mass, fullness, tenderness"}              Rectovaginal: {yes no:[id number]}.  Confirms.              Anus:  normal sphincter  tone, no lesions  Chaperone was present for exam.  ASSESSMENT     PLAN    An After Visit Summary was printed and given to the patient.  *** minutes face to face time of which over 50% was spent in counseling.

## 2018-08-01 NOTE — Telephone Encounter (Signed)
Spoke with patient. She is complaining of bleeding after intercourse. Denies any pelvic pain, fever or urinary symptoms. Offered appointment tomorrow but unable to come. Made patient appointment for 08-05-2018 3:00pm with Dr.Jertson.(patient requested PM appt.).

## 2018-08-05 ENCOUNTER — Ambulatory Visit: Payer: Commercial Managed Care - PPO | Admitting: Obstetrics and Gynecology

## 2018-08-06 ENCOUNTER — Telehealth: Payer: Self-pay | Admitting: Obstetrics and Gynecology

## 2018-08-06 NOTE — Telephone Encounter (Signed)
Patient is having abnormal bleeding. °

## 2018-08-06 NOTE — Telephone Encounter (Signed)
Left message to call Yolanda Hildebrandt, RN at [facility] [phone].   

## 2018-08-15 NOTE — Telephone Encounter (Signed)
She should have repeat testing in 3 months. She should use condoms.

## 2018-08-15 NOTE — Telephone Encounter (Signed)
Follow up call to patient. Patient had not returned call from 08-06-2018. Patient states that she went to [facility] on 7-30 for STD screening. Patient states that she was not notified until 08-12-2018 that her testing came back positive for Chlamydia. Patient states she started taking doxycycline 100mg  twice a day on Tuesday (8-11). States she was prescribed a 10 day course. Patient states she was not advised about a follow up appointment and desires follow up to make sure she's okay. Patient states partner is also being treated for positive results. RN advised would review with Dr. Talbert Nan on follow up and return call with recommendations. Patient advised to contact UC facility to request records and results from visit be sent to our office. Patient agreeable.   Routing to provider for review and advise.

## 2018-08-16 NOTE — Telephone Encounter (Signed)
Message left to return call to Triage Nurse at [phone].    

## 2018-08-19 NOTE — Telephone Encounter (Signed)
Patient returned call. 3 month TOC scheduled for 11-21-2018 at 1600. Patient agreeable to date and time of appointment.   Encounter closed.

## 2018-08-20 ENCOUNTER — Ambulatory Visit: Payer: Self-pay | Admitting: Obstetrics & Gynecology

## 2018-08-26 ENCOUNTER — Telehealth: Payer: Self-pay | Admitting: Obstetrics and Gynecology

## 2018-08-26 NOTE — Telephone Encounter (Signed)
Spoke with patient, advised per Dr. Talbert Nan. Patient completed abx for chlamydia on 08/12/18. OV scheduled for 9/9 at 4:30pm with Dr. Talbert Nan. Patient declined earlier appts offered. Patient verbalizes understanding.   Routing to provider for final review. Patient is agreeable to disposition. Will close encounter.

## 2018-08-26 NOTE — Telephone Encounter (Signed)
Patient is calling regarding TOC appointment. Patient would like to come in ASAP to be tested and "make sure it's gone." Patient stated that she had intercourse over the weekend and is afraid that she is still able to spread Chlamydia.

## 2018-08-26 NOTE — Telephone Encounter (Signed)
Please let her know that as long as she was 7 days out from her antibiotic treatment of the BV, she should be okay. The retesting for chlamydia can't be done until 3 weeks after treatment or she might get a false + results. Please set up retesting 3 weeks after she completed her antibiotics.

## 2018-08-26 NOTE — Telephone Encounter (Signed)
Call to patient. Patient states that she will like to come in as soon as possible to be rechecked for chlamydia. Patient states she had unprotected intercourse with a new partner on Saturday and is concerned she could have spread infection. RN advised patient we had reviewed the use of condoms last week and needs to make sure she uses condoms going forward. Patient states she is just "concerned" about what she should do. RN advised would review with Dr. Talbert Nan and and return call with recommendations. Patient agreeable.  Routing to provider for review.

## 2018-08-28 ENCOUNTER — Ambulatory Visit: Payer: Commercial Managed Care - PPO | Admitting: Obstetrics and Gynecology

## 2018-09-03 NOTE — Progress Notes (Signed)
GYNECOLOGY  VISIT   HPI: 23 y.o.   Single White or Caucasian Not Hispanic or Latino  female   G1P0010 with Patient's last menstrual period was 08/19/2018 (exact date).   here for chlamydia follow up. She had a + chlamydia screen in Urgent care in 7/20. She would like early retesting, has had unprotected sex since being treated (new partner). She is taking an eu natural contraception. It regulated her period. Not currently using any contraception. She is not currently active, but would like to get pregnant. Currently feeling fine.   GYNECOLOGIC HISTORY: Patient's last menstrual period was 08/19/2018 (exact date). Contraception: None Menopausal hormone therapy: None        OB History    Gravida  1   Para      Term      Preterm      AB  1   Living        SAB  1   TAB      Ectopic      Multiple      Live Births                 Patient Active Problem List   Diagnosis Date Noted  . Heart murmur     Past Medical History:  Diagnosis Date  . Dysmenorrhea   . Heart murmur   . STD (sexually transmitted disease)     Past Surgical History:  Procedure Laterality Date  . WISDOM TOOTH EXTRACTION  10/2015    No current outpatient medications on file.   No current facility-administered medications for this visit.      ALLERGIES: Patient has no known allergies.  Family History  Problem Relation Age of Onset  . Breast cancer Maternal Grandmother   . Heart disease Maternal Grandmother   . Heart disease Maternal Grandfather   . Diabetes Paternal Grandfather     Social History   Socioeconomic History  . Marital status: Single    Spouse name: Not on file  . Number of children: Not on file  . Years of education: Not on file  . Highest education level: Not on file  Occupational History  . Not on file  Social Needs  . Financial resource strain: Not on file  . Food insecurity    Worry: Not on file    Inability: Not on file  . Transportation needs   Medical: Not on file    Non-medical: Not on file  Tobacco Use  . Smoking status: Never Smoker  . Smokeless tobacco: Never Used  Substance and Sexual Activity  . Alcohol use: No    Alcohol/week: 0.0 standard drinks  . Drug use: No  . Sexual activity: Yes    Partners: Male    Birth control/protection: None  Lifestyle  . Physical activity    Days per week: Not on file    Minutes per session: Not on file  . Stress: Not on file  Relationships  . Social Herbalist on phone: Not on file    Gets together: Not on file    Attends religious service: Not on file    Active member of club or organization: Not on file    Attends meetings of clubs or organizations: Not on file    Relationship status: Not on file  . Intimate partner violence    Fear of current or ex partner: Not on file    Emotionally abused: Not on file    Physically abused: Not  on file    Forced sexual activity: Not on file  Other Topics Concern  . Not on file  Social History Narrative  . Not on file    Review of Systems  Constitutional: Negative.   HENT: Negative.   Eyes: Negative.   Respiratory: Negative.   Cardiovascular: Negative.   Gastrointestinal: Negative.   Genitourinary: Negative.   Musculoskeletal: Negative.   Skin: Negative.   Neurological: Negative.   Endo/Heme/Allergies: Negative.   Psychiatric/Behavioral: Negative.     PHYSICAL EXAMINATION:    BP 104/60 (BP Location: Right Arm, Patient Position: Sitting, Cuff Size: Normal)   Pulse 76   Temp (!) 97.2 F (36.2 C) (Skin)   Wt 151 lb 9.6 oz (68.8 kg)   LMP 08/19/2018 (Exact Date)   BMI 26.85 kg/m     General appearance: alert, cooperative and appears stated age  Pelvic: External genitalia:  no lesions              Urethra:  normal appearing urethra with no masses, tenderness or lesions              Bartholins and Skenes: normal                 Vagina: normal appearing vagina with a slight increase in watery, white vaginal d/c  (denies symptoms)              Cervix:  no lesions  Chaperone was present for exam.  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

## 2018-09-11 ENCOUNTER — Other Ambulatory Visit: Payer: Self-pay

## 2018-09-11 ENCOUNTER — Ambulatory Visit (INDEPENDENT_AMBULATORY_CARE_PROVIDER_SITE_OTHER): Payer: Commercial Managed Care - PPO | Admitting: Obstetrics and Gynecology

## 2018-09-11 ENCOUNTER — Encounter: Payer: Self-pay | Admitting: Obstetrics and Gynecology

## 2018-09-11 VITALS — BP 104/60 | HR 76 | Temp 97.2°F | Wt 151.6 lb

## 2018-09-11 DIAGNOSIS — Z113 Encounter for screening for infections with a predominantly sexual mode of transmission: Secondary | ICD-10-CM

## 2018-09-11 DIAGNOSIS — Z8619 Personal history of other infectious and parasitic diseases: Secondary | ICD-10-CM | POA: Diagnosis not present

## 2018-09-11 HISTORY — DX: Personal history of other infectious and parasitic diseases: Z86.19

## 2018-09-16 LAB — CHLAMYDIA/GONOCOCCUS/TRICHOMONAS, NAA
Chlamydia by NAA: NEGATIVE
Gonococcus by NAA: NEGATIVE
Trich vag by NAA: NEGATIVE

## 2018-10-30 ENCOUNTER — Ambulatory Visit: Payer: Commercial Managed Care - PPO | Admitting: Obstetrics and Gynecology

## 2018-10-30 ENCOUNTER — Telehealth: Payer: Self-pay | Admitting: Obstetrics and Gynecology

## 2018-10-30 NOTE — Telephone Encounter (Signed)
Patient is calling regarding possible reoccurrence of BV. Patient stated that she has noticed an odor, discharge and itching.

## 2018-10-30 NOTE — Telephone Encounter (Signed)
Call to patient. Patient states that she thinks she has BV again. Has noticed an odor for "a while," and states the "runny" discharge and itching started within the last week. No partner change since last appointment in September. Denies fever, pelvic pain, irritation, burning or dyspareunia. RN advised OV recommended for further evaluation. Patient scheduled for today, 10-28, at 1130. Patient agreeable to date and time of appointment. Covid prescreening negative.   Routing to provider and will close encounter.

## 2018-10-31 ENCOUNTER — Encounter: Payer: Self-pay | Admitting: Obstetrics and Gynecology

## 2018-10-31 ENCOUNTER — Other Ambulatory Visit: Payer: Self-pay

## 2018-10-31 ENCOUNTER — Ambulatory Visit (INDEPENDENT_AMBULATORY_CARE_PROVIDER_SITE_OTHER): Payer: Commercial Managed Care - PPO | Admitting: Obstetrics and Gynecology

## 2018-10-31 VITALS — BP 110/78 | HR 76 | Temp 97.2°F | Wt 152.0 lb

## 2018-10-31 DIAGNOSIS — Z113 Encounter for screening for infections with a predominantly sexual mode of transmission: Secondary | ICD-10-CM

## 2018-10-31 DIAGNOSIS — A5901 Trichomonal vulvovaginitis: Secondary | ICD-10-CM

## 2018-10-31 MED ORDER — METRONIDAZOLE 500 MG PO TABS: 500.0000 mg | ORAL_TABLET | Freq: Two times a day (BID) | ORAL | 0 refills | Status: DC

## 2018-10-31 NOTE — Patient Instructions (Signed)
Trichomoniasis Trichomoniasis is an STI (sexually transmitted infection) that can affect both women and men. In women, the outer area of the female genitalia (vulva) and the vagina are affected. In men, mainly the penis is affected, but the prostate and other reproductive organs can also be involved.  This condition can be treated with medicine. It often has no symptoms (is asymptomatic), especially in men. If not treated, trichomoniasis can last for months or years. What are the causes? This condition is caused by a parasite called Trichomonas vaginalis. Trichomoniasis most often spreads from person to person (is contagious) through sexual contact. What increases the risk? The following factors may make you more likely to develop this condition:  Having unprotected sex.  Having sex with a partner who has trichomoniasis.  Having multiple sexual partners.  Having had previous trichomoniasis infections or other STIs. What are the signs or symptoms? In women, symptoms of trichomoniasis include:  Abnormal vaginal discharge that is clear, white, gray, or yellow-green and foamy and has an unusual "fishy" odor.  Itching and irritation of the vagina and vulva.  Burning or pain during urination or sex.  Redness and swelling of the genitals. In men, symptoms of trichomoniasis include:  Penile discharge that may be foamy or contain pus.  Pain in the penis. This may happen only when urinating.  Itching or irritation inside the penis.  Burning after urination or ejaculation. How is this diagnosed? In women, this condition may be found during a routine Pap test or physical exam. It may be found in men during a routine physical exam. Your health care provider may do tests to help diagnose this infection, such as:  Urine tests (men and women).  The following in women: ? Testing the pH of the vagina. ? A vaginal swab test that checks for the Trichomonas vaginalis parasite. ? Testing vaginal  secretions. Your health care provider may test you for other STIs, including HIV (human immunodeficiency virus). How is this treated? This condition is treated with medicine taken by mouth (orally), such as metronidazole or tinidazole, to fight the infection. Your sexual partner(s) also need to be tested and treated.  If you are a woman and you plan to become pregnant or think you may be pregnant, tell your health care provider right away. Some medicines that are used to treat the infection should not be taken during pregnancy. Your health care provider may recommend over-the-counter medicines or creams to help relieve itching or irritation. You may be tested for infection again 3 months after treatment. Follow these instructions at home:  Take and use over-the-counter and prescription medicines, including creams, only as told by your health care provider.  Take your antibiotic medicine as told by your health care provider. Do not stop taking the antibiotic even if you start to feel better.  Do not have sex until 7-10 days after you finish your medicine, or until your health care provider approves. Ask your health care provider when you may start to have sex again.  (Women) Do not douche or wear tampons while you have the infection.  Discuss your infection with your sexual partner(s). Make sure that your partner gets tested and treated, if necessary.  Keep all follow-up visits as told by your health care provider. This is important. How is this prevented?   Use condoms every time you have sex. Using condoms correctly and consistently can help protect against STIs.  Avoid having multiple sexual partners.  Talk with your sexual partner about any   symptoms that either of you may have, as well as any history of STIs.  Get tested for STIs and STDs (sexually transmitted diseases) before you have sex. Ask your partner to do the same.  Do not have sexual contact if you have symptoms of  trichomoniasis or another STI. Contact a health care provider if:  You still have symptoms after you finish your medicine.  You develop pain in your abdomen.  You have pain when you urinate.  You have bleeding after sex.  You develop a rash.  You feel nauseous or you vomit.  You plan to become pregnant or think you may be pregnant. Summary  Trichomoniasis is an STI (sexually transmitted infection) that can affect both women and men.  This condition often has no symptoms (is asymptomatic), especially in men.  Without treatment, this condition can last for months or years.  You should not have sex until 7-10 days after you finish your medicine, or until your health care provider approves. Ask your health care provider when you may start to have sex again.  Discuss your infection with your sexual partner(s). Make sure that your partner gets tested and treated, if necessary. This information is not intended to replace advice given to you by your health care provider. Make sure you discuss any questions you have with your health care provider. Document Released: 06/14/2000 Document Revised: 10/02/2017 Document Reviewed: 10/02/2017 Elsevier Patient Education  2020 Elsevier Inc[employer]  

## 2018-10-31 NOTE — Progress Notes (Signed)
GYNECOLOGY  VISIT   HPI: 23 y.o.   Single White or Caucasian Not Hispanic or Latino  female   G1P0010 with Patient's last menstrual period was 10/17/2018 (exact date).   here for vaginal odor, vaginal discharge, and rash.  Symptoms began 1 week ago. Her d/c is watery. She has a rash in her bikini area, itchy. Some vaginal irritation. She denies sexual activity since her STD testing in 9/20 (negative for GC/CT/trich).  GYNECOLOGIC HISTORY: Patient's last menstrual period was 10/17/2018 (exact date). Contraception: None Menopausal hormone therapy: None       OB History    Gravida  1   Para      Term      Preterm      AB  1   Living        SAB  1   TAB      Ectopic      Multiple      Live Births                 Patient Active Problem List   Diagnosis Date Noted  . History of chlamydia 09/11/2018  . Heart murmur     Past Medical History:  Diagnosis Date  . Dysmenorrhea   . Heart murmur   . STD (sexually transmitted disease)     Past Surgical History:  Procedure Laterality Date  . WISDOM TOOTH EXTRACTION  10/2015    No current outpatient medications on file.   No current facility-administered medications for this visit.      ALLERGIES: Patient has no known allergies.  Family History  Problem Relation Age of Onset  . Breast cancer Maternal Grandmother   . Heart disease Maternal Grandmother   . Heart disease Maternal Grandfather   . Diabetes Paternal Grandfather     Social History   Socioeconomic History  . Marital status: Single    Spouse name: Not on file  . Number of children: Not on file  . Years of education: Not on file  . Highest education level: Not on file  Occupational History  . Not on file  Social Needs  . Financial resource strain: Not on file  . Food insecurity    Worry: Not on file    Inability: Not on file  . Transportation needs    Medical: Not on file    Non-medical: Not on file  Tobacco Use  . Smoking status:  Never Smoker  . Smokeless tobacco: Never Used  Substance and Sexual Activity  . Alcohol use: No    Alcohol/week: 0.0 standard drinks  . Drug use: No  . Sexual activity: Yes    Partners: Male    Birth control/protection: None  Lifestyle  . Physical activity    Days per week: Not on file    Minutes per session: Not on file  . Stress: Not on file  Relationships  . Social Herbalist on phone: Not on file    Gets together: Not on file    Attends religious service: Not on file    Active member of club or organization: Not on file    Attends meetings of clubs or organizations: Not on file    Relationship status: Not on file  . Intimate partner violence    Fear of current or ex partner: Not on file    Emotionally abused: Not on file    Physically abused: Not on file    Forced sexual activity: Not on file  Other Topics Concern  . Not on file  Social History Narrative  . Not on file    Review of Systems  Constitutional: Negative.   HENT: Negative.   Eyes: Negative.   Respiratory: Negative.   Cardiovascular: Negative.   Gastrointestinal: Negative.   Genitourinary:       Vaginal odor Vaginal discharge Vaginal itching Vaginal rash  Musculoskeletal: Negative.   Skin: Negative.   Neurological: Negative.   Endo/Heme/Allergies: Negative.   Psychiatric/Behavioral: Negative.     PHYSICAL EXAMINATION:    BP 110/78 (BP Location: Right Arm, Patient Position: Sitting, Cuff Size: Normal)   Pulse 76   Temp (!) 97.2 F (36.2 C) (Skin)   Wt 152 lb (68.9 kg)   LMP 10/17/2018 (Exact Date)   BMI 26.93 kg/m     General appearance: alert, cooperative and appears stated age Upper thighs: normal  Pelvic: External genitalia:  no lesions, minimal erythema              Urethra:  normal appearing urethra with no masses, tenderness or lesions              Bartholins and Skenes: normal                 Vagina: slightly erythematous appearing vagina a marked increase in green,  frothy, watery vaginal d/c              Cervix: no lesions               Chaperone was present for exam.  Wet prep: no clue, + trich, + wbc KOH: no yeast PH: 5.5   ASSESSMENT Trich vaginitis    PLAN Treat with flagyl 500 mg BID x 7 days Partners should be notified and treated No intercourse until one week after treatment, use condosm F/U in 11/20 for annual exam and TOC   An After Visit Summary was printed and given to the patient.  ~15 minutes face to face time of which over 50% was spent in counseling.

## 2018-11-04 ENCOUNTER — Telehealth: Payer: Self-pay | Admitting: *Deleted

## 2018-11-04 LAB — GC/CHLAMYDIA PROBE AMP
Chlamydia trachomatis, NAA: POSITIVE — AB
Neisseria Gonorrhoeae by PCR: NEGATIVE

## 2018-11-04 MED ORDER — AZITHROMYCIN 250 MG PO TABS: 1000.0000 mg | ORAL_TABLET | Freq: Once | ORAL | 0 refills | Status: AC

## 2018-11-04 NOTE — Telephone Encounter (Signed)
Spoke with patient, advised of all results as seen below per Dr. Talbert Nan.   EPT reviewed, patient declined at this time. Is aware to return call to office if Rx desired.   AEX r/s to 12/16/18 at 4pm with Dr. Talbert Nan, per patient request. Patient will have full STD screen when she returns.   Patient verbalizes understanding and is agreeable.   Confidential Communicable Disease report completed and faxed to [facility].   Encounter closed.

## 2018-11-04 NOTE — Telephone Encounter (Signed)
-----   Message from Salvadore Dom, MD sent at 11/04/2018 11:24 AM EST ----- Please inform the patient that she has chlamydia again, and treat with azithromycin 1 gram po x 1. Please offer expedited partner therapy. They should avoid intercourse until one week after they have both been treated, then use condoms. She needs a f/u cervical culture in 3 months. She is coming back at the end of 11/21/18 for repeat testing for trich and an annual exam. If she wants earlier testing for chlamydia (which she has wanted in the past), she needs to wait at least 3 weeks after treatment to get tested. You could push her annual out a couple of weeks and we can do all of it at once. I would recommend full STD testing again (either now or when she returns)

## 2018-11-04 NOTE — Telephone Encounter (Signed)
Patient returned call

## 2018-11-04 NOTE — Telephone Encounter (Signed)
Notes recorded by Burnice Logan, RN on 11/04/2018 at 2:20 PM EST  Left message to call Sharee Pimple, RN at [facility].

## 2018-11-21 ENCOUNTER — Ambulatory Visit: Payer: Commercial Managed Care - PPO | Admitting: Obstetrics and Gynecology

## 2018-12-16 ENCOUNTER — Other Ambulatory Visit (HOSPITAL_COMMUNITY)
Admission: RE | Admit: 2018-12-16 | Discharge: 2018-12-16 | Disposition: A | Discharge status: Home / Self Care | Payer: Commercial Managed Care - PPO | Source: Ambulatory Visit | Attending: Obstetrics and Gynecology | Admitting: Obstetrics and Gynecology

## 2018-12-16 ENCOUNTER — Other Ambulatory Visit: Payer: Self-pay

## 2018-12-16 ENCOUNTER — Ambulatory Visit (INDEPENDENT_AMBULATORY_CARE_PROVIDER_SITE_OTHER): Payer: Commercial Managed Care - PPO | Admitting: Obstetrics and Gynecology

## 2018-12-16 ENCOUNTER — Encounter: Payer: Self-pay | Admitting: Obstetrics and Gynecology

## 2018-12-16 VITALS — BP 120/60 | HR 83 | Temp 97.3°F | Ht 63.25 in | Wt 150.0 lb

## 2018-12-16 DIAGNOSIS — Z0000 Encounter for general adult medical examination without abnormal findings: Secondary | ICD-10-CM

## 2018-12-16 DIAGNOSIS — Z01419 Encounter for gynecological examination (general) (routine) without abnormal findings: Secondary | ICD-10-CM

## 2018-12-16 DIAGNOSIS — Z113 Encounter for screening for infections with a predominantly sexual mode of transmission: Secondary | ICD-10-CM

## 2018-12-16 DIAGNOSIS — Z124 Encounter for screening for malignant neoplasm of cervix: Secondary | ICD-10-CM

## 2018-12-16 DIAGNOSIS — Z8619 Personal history of other infectious and parasitic diseases: Secondary | ICD-10-CM | POA: Diagnosis not present

## 2018-12-16 NOTE — Progress Notes (Signed)
23 y.o. G39P0010 Single White or Caucasian Not Hispanic or Latino female here for annual exam and test of cure for trich and repeat testing for Chlamydia. She has a new sexual partner in the last few weeks. No contraception. Would like to get pregnant.    Patient's last menstrual period was 11/24/2018 (exact date).          Cycles are monthly, sometimes a few days late. Bleeds x 3 days, saturates a pad in 4 hours. Some premenstrual cramping, tolerable.   Sexually active: Yes.    The current method of family planning is none.    Exercising: Yes.    The patient has a physically strenuous job, but has no regular exercise apart from work.  Smoker:  no  Health Maintenance: Pap:  Never  History of abnormal Pap:  no TDaP:  2020 Gardasil: completed    reports that she has never smoked. She has never used smokeless tobacco. She reports that she does not drink alcohol or use drugs. 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 She is working in a [employer] doing 12 hour shifts.   Past Medical History:  Diagnosis Date  . Dysmenorrhea   . Heart murmur   . STD (sexually transmitted disease)     Past Surgical History:  Procedure Laterality Date  . WISDOM TOOTH EXTRACTION  10/2015    No current outpatient medications on file.   No current facility-administered medications for this visit.    Family History  Problem Relation Age of Onset  . Breast cancer Maternal Grandmother   . Heart disease Maternal Grandmother   . Heart disease Maternal Grandfather   . Diabetes Paternal Grandfather     Review of Systems  All other systems reviewed and are negative.   Exam:   BP 120/60   Pulse 83   Temp (!) 97.3 F (36.3 C)   Ht 5' 3.25" (1.607 m)   Wt 150 lb (68 kg)   LMP 11/24/2018 (Exact Date)   SpO2 95%   BMI 26.36 kg/m   Weight change: @WEIGHTCHANGE @ Height:   Height: 5' 3.25" (160.7 cm)  Ht Readings from Last 3 Encounters:  12/16/18 5' 3.25" (1.607 m)  01/25/18  5\' 3"  (1.6 m)  01/23/18 5\' 3"  (1.6 m)    General appearance: alert, cooperative and appears stated age Head: Normocephalic, without obvious abnormality, atraumatic Neck: no adenopathy, supple, symmetrical, trachea midline and thyroid normal to inspection and palpation Lungs: clear to auscultation bilaterally Cardiovascular: regular rate and rhythm Breasts: normal appearance, no masses or tenderness Abdomen: soft, non-tender; non distended,  no masses,  no organomegaly Extremities: extremities normal, atraumatic, no cyanosis or edema Skin: Skin color, texture, turgor normal. No rashes or lesions Lymph nodes: Cervical, supraclavicular, and axillary nodes normal. No abnormal inguinal nodes palpated Neurologic: Grossly normal   Pelvic: External genitalia:  no lesions              Urethra:  normal appearing urethra with no masses, tenderness or lesions              Bartholins and Skenes: normal                 Vagina: normal appearing vagina with normal color and discharge, no lesions              Cervix: no lesions               Bimanual Exam:  Uterus:  normal size,  contour, position, consistency, mobility, non-tender              Adnexa: no mass, fullness, tenderness               Rectovaginal: Confirms               Anus:  normal sphincter tone, no lesions  Gae Dry chaperoned for the exam.  A:  Well Woman with normal exam  Unprotected intercourse  P:   Pap with GC/CT/Trich  Start PNV  If she gets pregnant she needs early evaluation secondary to h/o CT, discussed risk of ectopic  Screening labs  Screening STD  Discussed breast self exam  Discussed calcium and vit D intake

## 2018-12-16 NOTE — Patient Instructions (Signed)
EXERCISE AND DIET:  We recommended that you start or continue a regular exercise program for good health. Regular exercise means any activity that makes your heart beat faster and makes you sweat.  We recommend exercising at least 30 minutes per day at least 3 days a week, preferably 4 or 5.  We also recommend a diet low in fat and sugar.  Inactivity, poor dietary choices and obesity can cause diabetes, heart attack, stroke, and kidney damage, among others.    ALCOHOL AND SMOKING:  Women should limit their alcohol intake to no more than 7 drinks/beers/glasses of wine (combined, not each!) per week. Moderation of alcohol intake to this level decreases your risk of breast cancer and liver damage. And of course, no recreational drugs are part of a healthy lifestyle.  And absolutely no smoking or even second hand smoke. Most people know smoking can cause heart and lung diseases, but did you know it also contributes to weakening of your bones? Aging of your skin?  Yellowing of your teeth and nails?  CALCIUM AND VITAMIN D:  Adequate intake of calcium and Vitamin D are recommended.  The recommendations for exact amounts of these supplements seem to change often, but generally speaking 1,000 mg of calcium (between diet and supplement) and 800 units of Vitamin D per day seems prudent. Certain women may benefit from higher intake of Vitamin D.  If you are among these women, your doctor will have told you during your visit.    PAP SMEARS:  Pap smears, to check for cervical cancer or precancers,  have traditionally been done yearly, although recent scientific advances have shown that most women can have pap smears less often.  However, every woman still should have a physical exam from her gynecologist every year. It will include a breast check, inspection of the vulva and vagina to check for abnormal growths or skin changes, a visual exam of the cervix, and then an exam to evaluate the size and shape of the uterus and  ovaries.  And after 21 years of age, a rectal exam is indicated to check for rectal cancers. We will also provide age appropriate advice regarding health maintenance, like when you should have certain vaccines, screening for sexually transmitted diseases, bone density testing, colonoscopy, mammograms, etc.   MAMMOGRAMS:  All women over 40 years old should have a yearly mammogram. Many facilities now offer a "3D" mammogram, which may cost around $50 extra out of pocket. If possible,  we recommend you accept the option to have the 3D mammogram performed.  It both reduces the number of women who will be called back for extra views which then turn out to be normal, and it is better than the routine mammogram at detecting truly abnormal areas.    COLON CANCER SCREENING: Now recommend starting at age 45. At this time colonoscopy is not covered for routine screening until 50. There are take home tests that can be done between 45-49.   COLONOSCOPY:  Colonoscopy to screen for colon cancer is recommended for all women at age 50.  We know, you hate the idea of the prep.  We agree, BUT, having colon cancer and not knowing it is worse!!  Colon cancer so often starts as a polyp that can be seen and removed at colonscopy, which can quite literally save your life!  And if your first colonoscopy is normal and you have no family history of colon cancer, most women don't have to have it again for   10 years.  Once every ten years, you can do something that may end up saving your life, right?  We will be happy to help you get it scheduled when you are ready.  Be sure to check your insurance coverage so you understand how much it will cost.  It may be covered as a preventative service at no cost, but you should check your particular policy.      Breast Self-Awareness Breast self-awareness means being familiar with how your breasts look and feel. It involves checking your breasts regularly and reporting any changes to your  health care provider. Practicing breast self-awareness is important. A change in your breasts can be a sign of a serious medical problem. Being familiar with how your breasts look and feel allows you to find any problems early, when treatment is more likely to be successful. All women should practice breast self-awareness, including women who have had breast implants. How to do a breast self-exam One way to learn what is normal for your breasts and whether your breasts are changing is to do a breast self-exam. To do a breast self-exam: Look for Changes  1. Remove all the clothing above your waist. 2. Stand in front of a mirror in a room with good lighting. 3. Put your hands on your hips. 4. Push your hands firmly downward. 5. Compare your breasts in the mirror. Look for differences between them (asymmetry), such as: ? Differences in shape. ? Differences in size. ? Puckers, dips, and bumps in one breast and not the other. 6. Look at each breast for changes in your skin, such as: ? Redness. ? Scaly areas. 7. Look for changes in your nipples, such as: ? Discharge. ? Bleeding. ? Dimpling. ? Redness. ? A change in position. Feel for Changes Carefully feel your breasts for lumps and changes. It is best to do this while lying on your back on the floor and again while sitting or standing in the shower or tub with soapy water on your skin. Feel each breast in the following way:  Place the arm on the side of the breast you are examining above your head.  Feel your breast with the other hand.  Start in the nipple area and make  inch (2 cm) overlapping circles to feel your breast. Use the pads of your three middle fingers to do this. Apply light pressure, then medium pressure, then firm pressure. The light pressure will allow you to feel the tissue closest to the skin. The medium pressure will allow you to feel the tissue that is a little deeper. The firm pressure will allow you to feel the tissue  close to the ribs.  Continue the overlapping circles, moving downward over the breast until you feel your ribs below your breast.  Move one finger-width toward the center of the body. Continue to use the  inch (2 cm) overlapping circles to feel your breast as you move slowly up toward your collarbone.  Continue the up and down exam using all three pressures until you reach your armpit.  Write Down What You Find  Write down what is normal for each breast and any changes that you find. Keep a written record with breast changes or normal findings for each breast. By writing this information down, you do not need to depend only on memory for size, tenderness, or location. Write down where you are in your menstrual cycle, if you are still menstruating. If you are having trouble noticing differences   in your breasts, do not get discouraged. With time you will become more familiar with the variations in your breasts and more comfortable with the exam. How often should I examine my breasts? Examine your breasts every month. If you are breastfeeding, the best time to examine your breasts is after a feeding or after using a breast pump. If you menstruate, the best time to examine your breasts is 5-7 days after your period is over. During your period, your breasts are lumpier, and it may be more difficult to notice changes. When should I see my health care provider? See your health care provider if you notice:  A change in shape or size of your breasts or nipples.  A change in the skin of your breast or nipples, such as a reddened or scaly area.  Unusual discharge from your nipples.  A lump or thick area that was not there before.  Pain in your breasts.  Anything that concerns you.   Chlamydia, Female Chlamydia is an STD (sexually transmitted disease). It is a bacterial infection that spreads (is contagious) through sexual contact. Chlamydia can occur in different areas of the body,  including:  The tube that moves urine from the bladder out of the body (urethra).  The lower part of the uterus (cervix).  The throat.  The rectum. This condition is not difficult to treat. However, if left untreated, chlamydia can lead to more serious health problems, including pelvic inflammatory disorder (PID). PID can increase your risk of not being able to have children (sterility). Also, if chlamydia is left untreated and you are pregnant or become pregnant, there is a chance that your baby can become infected during delivery. This may cause serious health problems for the baby. What are the causes? Chlamydia is caused by the bacteria Chlamydia trachomatis. It is passed from an infected partner during sexual activity. Chlamydia can spread through contact with the genitals, mouth, or rectum. What are the signs or symptoms? In some cases, there may not be any symptoms for this condition (asymptomatic), especially early in the infection. If symptoms develop, they may include:  Burning with urination.  Frequent urination.  Vaginal discharge.  Redness, soreness, and swelling (inflammation) of the rectum.  Bleeding or discharge from the rectum.  Abdominal pain.  Pain during sexual intercourse.  Bleeding between menstrual periods.  Itching, burning, or redness in the eyes, or discharge from the eyes. How is this diagnosed? This condition may be diagnosed with:  Urine tests.  Swab tests. Depending on your symptoms, your health care provider may use a cotton swab to collect discharge from your vagina or rectum to test for the bacteria.  A pelvic exam. How is this treated? This condition is treated with antibiotic medicines. If you are pregnant, certain types of antibiotics will need to be avoided. Follow these instructions at home: Medicines  Take over-the-counter and prescription medicines only as told by your health care provider.  Take your antibiotic medicine as told  by your health care provider. Do not stop taking the antibiotic even if you start to feel better. Sexual activity  Tell sexual partners about your infection. This includes any oral, anal, or vaginal sex partners you have had within 60 days of when your symptoms started. Sexual partners should also be treated, even if they have no signs of the disease.  Do not have sex until you and your sexual partners have completed treatment and your health care provider says it is okay. If your health  care provider prescribed you a single dose treatment, wait 7 days after taking the treatment before having sex. General instructions  It is your responsibility to get your test results. Ask your health care provider, or the department performing the test, when your results will be ready.  Get plenty of rest.  Eat a healthy, well-balanced diet.  Drink enough fluids to keep your urine clear or pale yellow.  Keep all follow-up visits as told by your health care provider. This is important. You may need to be tested for infection again 3 months after treatment. How is this prevented? The only sure way to prevent chlamydia is to avoid having sex. However, you can lower your risk by:  Using latex condoms correctly every time you have sex.  Not having multiple sexual partners.  Asking if your sexual partner has been tested for STIs and had negative results. Contact a health care provider if:  You develop new symptoms or your symptoms do not get better after completing treatment.  You have a fever or chills.  You have pain during sexual intercourse. Get help right away if:  Your pain gets worse and does not get better with medicine.  You develop flu-like symptoms, such as night sweats, sore throat, or muscle aches.  You experience nausea or vomiting.  You have difficulty swallowing.  You have bleeding between periods or after sex.  You have irregular menstrual periods.  You have abdominal or  lower back pain that does not get better with medicine.  You feel weak or dizzy, or you faint.  You are pregnant and you develop symptoms of chlamydia. Summary  Chlamydia is an STD (sexually transmitted disease). It is a bacterial infection that spreads (is contagious) through sexual contact.  This condition is not difficult to treat, however. If left untreated, chlamydia can lead to more serious health problems, including pelvic inflammatory disease (PID).  In some cases, there may not be any symptoms for this condition (asymptomatic).  This condition is treated with antibiotic medicines.  Using latex condoms correctly every time you have sex can help prevent chlamydia. This information is not intended to replace advice given to you by your health care provider. Make sure you discuss any questions you have with your health care provider. Document Released: 09/28/2004 Document Revised: 06/12/2017 Document Reviewed: 12/06/2015 Elsevier Patient Education  2020 [employer].

## 2018-12-18 LAB — COMPREHENSIVE METABOLIC PANEL
ALT: 14 IU/L (ref 0–32)
AST: 10 IU/L (ref 0–40)
Albumin/Globulin Ratio: 1.8 (ref 1.2–2.2)
Albumin: 4.4 g/dL (ref 3.9–5.0)
Alkaline Phosphatase: 82 IU/L (ref 39–117)
BUN/Creatinine Ratio: 10 (ref 9–23)
BUN: 7 mg/dL (ref 6–20)
Bilirubin Total: 0.6 mg/dL (ref 0.0–1.2)
CO2: 25 mmol/L (ref 20–29)
Calcium: 8.4 mg/dL — ABNORMAL LOW (ref 8.7–10.2)
Chloride: 103 mmol/L (ref 96–106)
Creatinine, Ser: 0.73 mg/dL (ref 0.57–1.00)
GFR calc Af Amer: 136 mL/min/{1.73_m2} (ref >59)
GFR calc non Af Amer: 118 mL/min/{1.73_m2} (ref >59)
Globulin, Total: 2.5 g/dL (ref 1.5–4.5)
Glucose: 96 mg/dL (ref 65–99)
Potassium: 4.1 mmol/L (ref 3.5–5.2)
Sodium: 143 mmol/L (ref 134–144)
Total Protein: 6.9 g/dL (ref 6.0–8.5)

## 2018-12-18 LAB — CBC
Hematocrit: 38 % (ref 34.0–46.6)
Hemoglobin: 13.1 g/dL (ref 11.1–15.9)
MCH: 30.9 pg (ref 26.6–33.0)
MCHC: 34.5 g/dL (ref 31.5–35.7)
MCV: 90 fL (ref 79–97)
Platelets: 272 10*3/uL (ref 150–450)
RBC: 4.24 x10E6/uL (ref 3.77–5.28)
RDW: 12 % (ref 11.7–15.4)
WBC: 7.1 10*3/uL (ref 3.4–10.8)

## 2018-12-18 LAB — HEP, RPR, HIV PANEL
HIV Screen 4th Generation wRfx: NONREACTIVE
Hepatitis B Surface Ag: NEGATIVE
RPR Ser Ql: NONREACTIVE

## 2018-12-18 LAB — HEPATITIS C ANTIBODY: Hep C Virus Ab: <0.1 s/co ratio (ref 0.0–0.9)

## 2018-12-18 LAB — CHLAMYDIA/GONOCOCCUS/TRICHOMONAS, NAA
Chlamydia by NAA: NEGATIVE
Gonococcus by NAA: NEGATIVE
Trich vag by NAA: NEGATIVE

## 2018-12-18 LAB — LIPID PANEL
Chol/HDL Ratio: 2.3 ratio (ref 0.0–4.4)
Cholesterol, Total: 151 mg/dL (ref 100–199)
HDL: 65 mg/dL (ref >39)
LDL Chol Calc (NIH): 65 mg/dL (ref 0–99)
Triglycerides: 117 mg/dL (ref 0–149)
VLDL Cholesterol Cal: 21 mg/dL (ref 5–40)

## 2018-12-18 LAB — CYTOLOGY - PAP: Diagnosis: NEGATIVE

## 2019-01-16 ENCOUNTER — Telehealth: Payer: Self-pay | Admitting: Obstetrics and Gynecology

## 2019-01-16 NOTE — Telephone Encounter (Signed)
Left message to call Magon Croson, RN at [facility] [phone].   

## 2019-01-16 NOTE — Telephone Encounter (Signed)
Patient's period has not come on in 2 months.

## 2019-01-16 NOTE — Telephone Encounter (Signed)
Yes have her come in for a BhcG (not stat), TSH and prolactin level for missed menses

## 2019-01-16 NOTE — Telephone Encounter (Signed)
Spoke with patient. LMP 11/24/18, menses was late and light, no menses since. Reports cramping, fatigue and increased cravings when she should be on menses. Denies any current symptoms. Hx of STDs. Trying to conceive, no contraceptive. Taking PNV.  UPT neg 1 month ago. Recommended patient take UPT, patient is requesting blood pregnancy test.   Advised patient I will review with Dr. Talbert Nan and return call with recommendations, patient agreeable.  Routing to Dr. Talbert Nan to review.

## 2019-01-20 NOTE — Telephone Encounter (Signed)
Left message to call [name], RN at [facility] [phone].   

## 2019-01-23 NOTE — Telephone Encounter (Signed)
Call to patient, left message requesting return call for update.   MyChart message to patient.

## 2019-01-28 NOTE — Telephone Encounter (Signed)
Dr. Talbert Nan, patient has not returned call x3. MyChart message unread. OK to close encounter?

## 2019-01-29 NOTE — Telephone Encounter (Signed)
Yes, will cancel labs and close encounter.

## 2019-01-30 ENCOUNTER — Telehealth: Payer: Self-pay | Admitting: Obstetrics and Gynecology

## 2019-01-30 NOTE — Telephone Encounter (Signed)
Patient is asking for an appointment for std testing with Dr.Jertson. To triage to assist with scheduling.

## 2019-01-30 NOTE — Telephone Encounter (Signed)
Spoke to pt. Pt states was told by partner that " needs to get tested" Pt states was SA with this partner on Dec 26. Pt was seen on 12/16/2019 for AEX and was negative for STDs. Pt denies sx of vaginal discharge or odor or itching. Pt scheduled for OV on 02/05/2019 at 4pm per pts request for work schedule. Pt verbalized understanding.   Will route to Dr Talbert Nan for review and will close encounter.

## 2019-02-05 ENCOUNTER — Ambulatory Visit (INDEPENDENT_AMBULATORY_CARE_PROVIDER_SITE_OTHER): Payer: Commercial Managed Care - PPO | Admitting: Obstetrics and Gynecology

## 2019-02-05 ENCOUNTER — Encounter: Payer: Self-pay | Admitting: Obstetrics and Gynecology

## 2019-02-05 ENCOUNTER — Other Ambulatory Visit: Payer: Self-pay

## 2019-02-05 VITALS — BP 122/78 | HR 81 | Temp 97.9°F | Ht 62.5 in | Wt 151.6 lb

## 2019-02-05 DIAGNOSIS — Z113 Encounter for screening for infections with a predominantly sexual mode of transmission: Secondary | ICD-10-CM

## 2019-02-05 DIAGNOSIS — N760 Acute vaginitis: Secondary | ICD-10-CM

## 2019-02-05 DIAGNOSIS — B373 Candidiasis of vulva and vagina: Secondary | ICD-10-CM

## 2019-02-05 DIAGNOSIS — B9689 Other specified bacterial agents as the cause of diseases classified elsewhere: Secondary | ICD-10-CM

## 2019-02-05 DIAGNOSIS — B3731 Acute candidiasis of vulva and vagina: Secondary | ICD-10-CM

## 2019-02-05 MED ORDER — METRONIDAZOLE 500 MG PO TABS: 500.0000 mg | ORAL_TABLET | Freq: Two times a day (BID) | ORAL | 0 refills | Status: DC

## 2019-02-05 MED ORDER — FLUCONAZOLE 150 MG PO TABS: 150.0000 mg | ORAL_TABLET | Freq: Once | ORAL | 0 refills | Status: AC

## 2019-02-05 NOTE — Progress Notes (Signed)
GYNECOLOGY  VISIT   HPI: 24 y.o.   Single White or Caucasian Not Hispanic or Latino  female   G1P0010 with Patient's last menstrual period was 01/19/2019.   here for   for STD testing.  An ex partner told her she should come get std testing. She is unsure what std he has or had. She just wants to make sure she is healthy. She states that she has not been with him since December. She feels fine, no pain. She has noticed a little bit of white vaginal d/c, not clumpy. No itching, burning or irritation.   GYNECOLOGIC HISTORY: Patient's last menstrual period was 01/19/2019. Contraception: none  Menopausal hormone therapy: none        OB History    Gravida  1   Para      Term      Preterm      AB  1   Living        SAB  1   TAB      Ectopic      Multiple      Live Births                 Patient Active Problem List   Diagnosis Date Noted  . History of chlamydia 09/11/2018  . Heart murmur     Past Medical History:  Diagnosis Date  . Dysmenorrhea   . Heart murmur   . STD (sexually transmitted disease)     Past Surgical History:  Procedure Laterality Date  . WISDOM TOOTH EXTRACTION  10/2015    No current outpatient medications on file.   No current facility-administered medications for this visit.     ALLERGIES: Patient has no known allergies.  Family History  Problem Relation Age of Onset  . Breast cancer Maternal Grandmother   . Heart disease Maternal Grandmother   . Heart disease Maternal Grandfather   . Diabetes Paternal Grandfather     Social History   Socioeconomic History  . Marital status: Single    Spouse name: Not on file  . Number of children: Not on file  . Years of education: Not on file  . Highest education level: Not on file  Occupational History  . Not on file  Tobacco Use  . Smoking status: Never Smoker  . Smokeless tobacco: Never Used  Substance and Sexual Activity  . Alcohol use: No    Alcohol/week: 0.0 standard drinks   . Drug use: No  . Sexual activity: Yes    Partners: Male    Birth control/protection: None  Other Topics Concern  . Not on file  Social History Narrative  . Not on file   Social Determinants of Health   Financial Resource Strain:   . Difficulty of Paying Living Expenses: Not on file  Food Insecurity:   . Worried About Charity fundraiser in the Last Year: Not on file  . Ran Out of Food in the Last Year: Not on file  Transportation Needs:   . Lack of Transportation (Medical): Not on file  . Lack of Transportation (Non-Medical): Not on file  Physical Activity:   . Days of Exercise per Week: Not on file  . Minutes of Exercise per Session: Not on file  Stress:   . Feeling of Stress : Not on file  Social Connections:   . Frequency of Communication with Friends and Family: Not on file  . Frequency of Social Gatherings with Friends and Family: Not on  file  . Attends Religious Services: Not on file  . Active Member of Clubs or Organizations: Not on file  . Attends Archivist Meetings: Not on file  . Marital Status: Not on file  Intimate Partner Violence:   . Fear of Current or Ex-Partner: Not on file  . Emotionally Abused: Not on file  . Physically Abused: Not on file  . Sexually Abused: Not on file    Review of Systems  All other systems reviewed and are negative.   PHYSICAL EXAMINATION:    BP 122/78   Pulse 81   Temp 97.9 F (36.6 C)   Ht 5' 2.5" (1.588 m)   Wt 151 lb 9.6 oz (68.8 kg)   LMP 01/19/2019   SpO2 97%   BMI 27.29 kg/m     General appearance: alert, cooperative and appears stated age  Pelvic: External genitalia:  no lesions              Urethra:  normal appearing urethra with no masses, tenderness or lesions              Bartholins and Skenes: normal                 Vagina: normal appearing vagina with an increase in watery, white vaginal d/c, small amount of thicker vaginal d/c             Cervix: no cervical motion tenderness and no  lesions              Bimanual Exam:  Uterus:  normal size, contour, position, consistency, mobility, non-tender              Adnexa: no mass, fullness, tenderness              Chaperone was present for exam.  Wet prep: ++ clue, ? trich, ++ wbc KOH: + rare yeast PH: 5    ASSESSMENT Screening for STD BV Yeast Concern for trich    PLAN Nuswab for GC/CT/trich Blood work for STD testing Flagyl 500 mg BID for one week Diflucan for yeast If sexually active, use condoms   Patient declined a Visit Summary

## 2019-02-06 LAB — HEP, RPR, HIV PANEL
HIV Screen 4th Generation wRfx: NONREACTIVE
Hepatitis B Surface Ag: NEGATIVE
RPR Ser Ql: NONREACTIVE

## 2019-02-06 LAB — HEPATITIS C ANTIBODY: Hep C Virus Ab: <0.1 s/co ratio (ref 0.0–0.9)

## 2019-02-07 LAB — CHLAMYDIA/GONOCOCCUS/TRICHOMONAS, NAA
Chlamydia by NAA: NEGATIVE
Gonococcus by NAA: NEGATIVE
Trich vag by NAA: NEGATIVE

## 2019-02-26 NOTE — Progress Notes (Signed)
GYNECOLOGY  VISIT   HPI: 24 y.o.   Single White or Caucasian Not Hispanic or Latino  female   G1P0010 with No LMP recorded.   here for follow up of bv. She had vaginal odor that she noticed right before her menstrual cycle but none now. She was treated for BV at the beginning of this month. Symptoms resolved, she noticed an odor again prior to her cycle on 02/24/19. Currently seems better. No itching, burning or irritation. She is worried about trich. She has been sexually active since her last visit, no condoms, same partner. Negative STD testing earlier this month.  GYNECOLOGIC HISTORY: No LMP recorded. Contraception: none Menopausal hormone therapy: none        OB History    Gravida  1   Para      Term      Preterm      AB  1   Living        SAB  1   TAB      Ectopic      Multiple      Live Births                 Patient Active Problem List   Diagnosis Date Noted  . History of chlamydia 09/11/2018  . Heart murmur     Past Medical History:  Diagnosis Date  . Dysmenorrhea   . Heart murmur   . STD (sexually transmitted disease)     Past Surgical History:  Procedure Laterality Date  . WISDOM TOOTH EXTRACTION  10/2015    Current Outpatient Medications  Medication Sig Dispense Refill  . metroNIDAZOLE (FLAGYL) 500 MG tablet Take 1 tablet (500 mg total) by mouth 2 (two) times daily. 14 tablet 0   No current facility-administered medications for this visit.     ALLERGIES: Patient has no known allergies.  Family History  Problem Relation Age of Onset  . Breast cancer Maternal Grandmother   . Heart disease Maternal Grandmother   . Heart disease Maternal Grandfather   . Diabetes Paternal Grandfather     Social History   Socioeconomic History  . Marital status: Single    Spouse name: Not on file  . Number of children: Not on file  . Years of education: Not on file  . Highest education level: Not on file  Occupational History  . Not on file   Tobacco Use  . Smoking status: Never Smoker  . Smokeless tobacco: Never Used  Substance and Sexual Activity  . Alcohol use: No    Alcohol/week: 0.0 standard drinks  . Drug use: No  . Sexual activity: Yes    Partners: Male    Birth control/protection: None  Other Topics Concern  . Not on file  Social History Narrative  . Not on file   Social Determinants of Health   Financial Resource Strain:   . Difficulty of Paying Living Expenses: Not on file  Food Insecurity:   . Worried About Charity fundraiser in the Last Year: Not on file  . Ran Out of Food in the Last Year: Not on file  Transportation Needs:   . Lack of Transportation (Medical): Not on file  . Lack of Transportation (Non-Medical): Not on file  Physical Activity:   . Days of Exercise per Week: Not on file  . Minutes of Exercise per Session: Not on file  Stress:   . Feeling of Stress : Not on file  Social Connections:   .  Frequency of Communication with Friends and Family: Not on file  . Frequency of Social Gatherings with Friends and Family: Not on file  . Attends Religious Services: Not on file  . Active Member of Clubs or Organizations: Not on file  . Attends Archivist Meetings: Not on file  . Marital Status: Not on file  Intimate Partner Violence:   . Fear of Current or Ex-Partner: Not on file  . Emotionally Abused: Not on file  . Physically Abused: Not on file  . Sexually Abused: Not on file    Review of Systems  Constitutional: Negative.   HENT: Negative.   Eyes: Negative.   Respiratory: Negative.   Cardiovascular: Negative.   Gastrointestinal: Negative.   Genitourinary:       Vaginal odor before menstrual cycle  Musculoskeletal: Negative.   Skin: Negative.   Neurological: Negative.   Endo/Heme/Allergies: Negative.   Psychiatric/Behavioral: Negative.     PHYSICAL EXAMINATION:    There were no vitals taken for this visit.    General appearance: alert, cooperative and appears  stated age  Pelvic: External genitalia:  no lesions              Urethra:  normal appearing urethra with no masses, tenderness or lesions              Bartholins and Skenes: normal                 Vagina: normal appearing vagina with some brown, mucous d/c              Cervix: no lesions                Chaperone was present for exam.  Wet prep: + clue, no clear trich, +++ wbc KOH: no yeast PH: 5   ASSESSMENT BV Large amount of WBC on vaginal slides    PLAN Flagyl 500 mg BID x 7 days Send nuswab for GC/CT/Trich, will hold on repeat blood work for STD testing Use condoms if sexually active.

## 2019-02-28 ENCOUNTER — Encounter: Payer: Self-pay | Admitting: Obstetrics and Gynecology

## 2019-02-28 ENCOUNTER — Other Ambulatory Visit: Payer: Self-pay

## 2019-02-28 ENCOUNTER — Ambulatory Visit (INDEPENDENT_AMBULATORY_CARE_PROVIDER_SITE_OTHER): Payer: Commercial Managed Care - PPO | Admitting: Obstetrics and Gynecology

## 2019-02-28 VITALS — BP 112/64 | HR 64 | Temp 97.7°F | Resp 16 | Wt 155.0 lb

## 2019-02-28 DIAGNOSIS — N898 Other specified noninflammatory disorders of vagina: Secondary | ICD-10-CM

## 2019-02-28 DIAGNOSIS — N760 Acute vaginitis: Secondary | ICD-10-CM

## 2019-02-28 DIAGNOSIS — B9689 Other specified bacterial agents as the cause of diseases classified elsewhere: Secondary | ICD-10-CM | POA: Diagnosis not present

## 2019-02-28 DIAGNOSIS — Z113 Encounter for screening for infections with a predominantly sexual mode of transmission: Secondary | ICD-10-CM | POA: Diagnosis not present

## 2019-02-28 MED ORDER — METRONIDAZOLE 500 MG PO TABS: 500.0000 mg | ORAL_TABLET | Freq: Two times a day (BID) | ORAL | 0 refills | Status: DC

## 2019-03-03 LAB — CHLAMYDIA/GONOCOCCUS/TRICHOMONAS, NAA
Chlamydia by NAA: NEGATIVE
Gonococcus by NAA: NEGATIVE
Trich vag by NAA: NEGATIVE

## 2019-03-28 ENCOUNTER — Telehealth: Payer: Self-pay | Admitting: Obstetrics and Gynecology

## 2019-03-28 DIAGNOSIS — N926 Irregular menstruation, unspecified: Secondary | ICD-10-CM

## 2019-03-28 NOTE — Telephone Encounter (Signed)
Patient is asking to come for a blood pregnancy test.

## 2019-03-28 NOTE — Telephone Encounter (Signed)
The patient has a h/o chlamydia, which increase her risk of ectopic pregnancy. She needs to be seen in  [facility] if she has any spotting or significant discomfort. Otherwise she needs to come in Monday morning for a stat BhcG.

## 2019-03-28 NOTE — Telephone Encounter (Signed)
Spoke with pt. Pt given recommendations to be seen for lab only on Monday. Pt agreeable. Lab visit scheduled for 845 am on 03/31/2019. Pt verbalized understanding. Pt given risk of ectopic d/t hx of chlamydia. Pt agreeable and will seek emergent care at [facility] if any spotting or severe abd pain over the weekend. Pt denies vaginal bleeding or abd pain at this time.   Routing to Dr Talbert Nan for review

## 2019-03-28 NOTE — Addendum Note (Signed)
Addended by: Georgia Lopes on: 03/28/2019 05:20 PM   Modules accepted: Orders

## 2019-03-28 NOTE — Telephone Encounter (Signed)
Spoke to pt. Pt states taking Flagyl for BV on 02/28/2019. LMP was 02/24/2019. Pt states missing cycle in March. Pt took home UPT and was positive on 03/25/2019. Pt scheduled for OV for pregnancy consult and labs on 04/14/19 at 1100 am. Pt agreeable. 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  Routing to Dr Talbert Nan for review and will close encounter.

## 2019-03-31 ENCOUNTER — Other Ambulatory Visit (INDEPENDENT_AMBULATORY_CARE_PROVIDER_SITE_OTHER): Payer: Commercial Managed Care - PPO

## 2019-03-31 ENCOUNTER — Other Ambulatory Visit: Payer: Self-pay

## 2019-03-31 DIAGNOSIS — N926 Irregular menstruation, unspecified: Secondary | ICD-10-CM

## 2019-03-31 LAB — BETA HCG QUANT (REF LAB): hCG Quant: <1 m[IU]/mL

## 2019-04-14 ENCOUNTER — Ambulatory Visit: Payer: Self-pay | Admitting: Obstetrics and Gynecology

## 2019-04-16 ENCOUNTER — Other Ambulatory Visit: Payer: Self-pay

## 2019-04-17 ENCOUNTER — Telehealth: Payer: Self-pay

## 2019-04-17 ENCOUNTER — Encounter: Payer: Self-pay | Admitting: Obstetrics and Gynecology

## 2019-04-17 ENCOUNTER — Ambulatory Visit: Payer: Self-pay | Admitting: Obstetrics and Gynecology

## 2019-04-17 NOTE — Telephone Encounter (Signed)
Patient called to cancel 3:00pm appointment for today (04/17/19). Patient does not wish to reschedule at this time.

## 2019-04-18 NOTE — Telephone Encounter (Signed)
Call to patient regarding canceled appointment. Unable to leave message- voice mail is full.

## 2019-04-19 ENCOUNTER — Encounter: Payer: Self-pay | Admitting: Obstetrics and Gynecology

## 2019-08-06 ENCOUNTER — Encounter: Payer: Self-pay | Admitting: Obstetrics and Gynecology

## 2019-08-06 ENCOUNTER — Other Ambulatory Visit: Payer: Self-pay

## 2019-08-06 ENCOUNTER — Ambulatory Visit (INDEPENDENT_AMBULATORY_CARE_PROVIDER_SITE_OTHER): Payer: Commercial Managed Care - PPO | Admitting: Obstetrics and Gynecology

## 2019-08-06 VITALS — BP 110/64 | HR 79 | Ht 62.5 in | Wt 153.0 lb

## 2019-08-06 DIAGNOSIS — Z113 Encounter for screening for infections with a predominantly sexual mode of transmission: Secondary | ICD-10-CM | POA: Diagnosis not present

## 2019-08-06 DIAGNOSIS — N926 Irregular menstruation, unspecified: Secondary | ICD-10-CM

## 2019-08-06 DIAGNOSIS — B9689 Other specified bacterial agents as the cause of diseases classified elsewhere: Secondary | ICD-10-CM | POA: Diagnosis not present

## 2019-08-06 DIAGNOSIS — N760 Acute vaginitis: Secondary | ICD-10-CM | POA: Diagnosis not present

## 2019-08-06 LAB — POCT URINE PREGNANCY: Preg Test, Ur: NEGATIVE

## 2019-08-06 MED ORDER — METRONIDAZOLE 500 MG PO TABS
500.0000 mg | ORAL_TABLET | Freq: Two times a day (BID) | ORAL | 0 refills | Status: DC
Start: 2019-08-06 — End: 2019-09-11

## 2019-08-06 NOTE — Patient Instructions (Signed)
Vaginitis Vaginitis is a condition in which the vaginal tissue swells and becomes red (inflamed). This condition is most often caused by a change in the normal balance of bacteria and yeast that live in the vagina. This change causes an overgrowth of certain bacteria or yeast, which causes the inflammation. There are different types of vaginitis, but the most common types are:  Bacterial vaginosis.  Yeast infection (candidiasis).  Trichomoniasis vaginitis. This is a sexually transmitted disease (STD).  Viral vaginitis.  Atrophic vaginitis.  Allergic vaginitis. What are the causes? The cause of this condition depends on the type of vaginitis. It can be caused by:  Bacteria (bacterial vaginosis).  Yeast, which is a fungus (yeast infection).  A parasite (trichomoniasis vaginitis).  A virus (viral vaginitis).  Low hormone levels (atrophic vaginitis). Low hormone levels can occur during pregnancy, breastfeeding, or after menopause.  Irritants, such as bubble baths, scented tampons, and feminine sprays (allergic vaginitis). Other factors can change the normal balance of the yeast and bacteria that live in the vagina. These include:  Antibiotic medicines.  Poor hygiene.  Diaphragms, vaginal sponges, spermicides, birth control pills, and intrauterine devices (IUD).  Sex.  Infection.  Uncontrolled diabetes.  A weakened defense (immune) system. What increases the risk? This condition is more likely to develop in women who:  Smoke.  Use vaginal douches, scented tampons, or scented sanitary pads.  Wear tight-fitting pants.  Wear thong underwear.  Use oral birth control pills or an IUD.  Have sex without a condom.  Have multiple sex partners.  Have an STD.  Frequently use the spermicide nonoxynol-9.  Eat lots of foods high in sugar.  Have uncontrolled diabetes.  Have low estrogen levels.  Have a weakened immune system from an immune disorder or medical  treatment.  Are pregnant or breastfeeding. What are the signs or symptoms? Symptoms vary depending on the cause of the vaginitis. Common symptoms include:  Abnormal vaginal discharge. ? The discharge is white, gray, or yellow with bacterial vaginosis. ? The discharge is thick, white, and cheesy with a yeast infection. ? The discharge is frothy and yellow or greenish with trichomoniasis.  A bad vaginal smell. The smell is fishy with bacterial vaginosis.  Vaginal itching, pain, or swelling.  Sex that is painful.  Pain or burning when urinating. Sometimes there are no symptoms. How is this diagnosed? This condition is diagnosed based on your symptoms and medical history. A physical exam, including a pelvic exam, will also be done. You may also have other tests, including:  Tests to determine the pH level (acidity or alkalinity) of your vagina.  A whiff test, to assess the odor that results when a sample of your vaginal discharge is mixed with a potassium hydroxide solution.  Tests of vaginal fluid. A sample will be examined under a microscope. How is this treated? Treatment varies depending on the type of vaginitis you have. Your treatment may include:  Antibiotic creams or pills to treat bacterial vaginosis and trichomoniasis.  Antifungal medicines, such as vaginal creams or suppositories, to treat a yeast infection.  Medicine to ease discomfort if you have viral vaginitis. Your sexual partner should also be treated.  Estrogen delivered in a cream, pill, suppository, or vaginal ring to treat atrophic vaginitis. If vaginal dryness occurs, lubricants and moisturizing creams may help. You may need to avoid scented soaps, sprays, or douches.  Stopping use of a product that is causing allergic vaginitis. Then using a vaginal cream to treat the symptoms. Follow   these instructions at home: Lifestyle  Keep your genital area clean and dry. Avoid soap, and only rinse the area with  water.  Do not douche or use tampons until your health care provider says it is okay to do so. Use sanitary pads, if needed.  Do not have sex until your health care provider approves. When you can return to sex, practice safe sex and use condoms.  Wipe from front to back. This avoids the spread of bacteria from the rectum to the vagina. General instructions  Take over-the-counter and prescription medicines only as told by your health care provider.  If you were prescribed an antibiotic medicine, take or use it as told by your health care provider. Do not stop taking or using the antibiotic even if you start to feel better.  Keep all follow-up visits as told by your health care provider. This is important. How is this prevented?  Use mild, non-scented products. Do not use things that can irritate the vagina, such as fabric softeners. Avoid the following products if they are scented: ? Feminine sprays. ? Detergents. ? Tampons. ? Feminine hygiene products. ? Soaps or bubble baths.  Let air reach your genital area. ? Wear cotton underwear to reduce moisture buildup. ? Avoid wearing underwear while you sleep. ? Avoid wearing tight pants and underwear or nylons without a cotton panel. ? Avoid wearing thong underwear.  Take off any wet clothing, such as bathing suits, as soon as possible.  Practice safe sex and use condoms. Contact a health care provider if:  You have abdominal pain.  You have a fever.  You have symptoms that last for more than 2-3 days. Get help right away if:  You have a fever and your symptoms suddenly get worse. Summary  Vaginitis is a condition in which the vaginal tissue becomes inflamed.This condition is most often caused by a change in the normal balance of bacteria and yeast that live in the vagina.  Treatment varies depending on the type of vaginitis you have.  Do not douche, use tampons , or have sex until your health care provider approves. When  you can return to sex, practice safe sex and use condoms. This information is not intended to replace advice given to you by your health care provider. Make sure you discuss any questions you have with your health care provider. Document Revised: 12/01/2016 Document Reviewed: 01/25/2016 Elsevier Patient Education  2020 Elsevier Inc.  

## 2019-08-06 NOTE — Progress Notes (Signed)
GYNECOLOGY  VISIT   HPI: 24 y.o.   Single White or Caucasian Not Hispanic or Latino  female   [id number] with Patient's last menstrual period was 07/01/2019.   here for Patient states that she has not had a period since June. She would also like std testing.   Her cycles have been coming mostly every 4 weeks, occasionally a little late. Never more than a week late. Cycles last for 4 days, normal flow, some premenstrual cramps. No constipation, dry skin, hair loss, fatigue. No increase in stress, no galactorrhea. Some increase in migraines. No auras. New sexual partner. Doesn't use condoms, finds them uncomfortable, hasn't used a lubricant. She would like to get pregnant, no contraception for over a year. She did have serious partner. Would be okay if it happened with the new partner.  She has noticed a slight increase in vaginal d/c. No odor, no itching, no burning, no irritation.   GYNECOLOGIC HISTORY: Patient's last menstrual period was 07/01/2019. Contraception: none  Menopausal hormone therapy: none         OB History    Gravida  1   Para      Term      Preterm      AB  1   Living        SAB  1   TAB      Ectopic      Multiple      Live Births                 Patient Active Problem List   Diagnosis Date Noted  . History of chlamydia 09/11/2018  . Heart murmur     Past Medical History:  Diagnosis Date  . Dysmenorrhea   . Heart murmur   . STD (sexually transmitted disease)     Past Surgical History:  Procedure Laterality Date  . WISDOM TOOTH EXTRACTION  10/2015    No current outpatient medications on file.   No current facility-administered medications for this visit.     ALLERGIES: Patient has no known allergies.  Family History  Problem Relation Age of Onset  . Breast cancer Maternal Grandmother   . Heart disease Maternal Grandmother   . Heart disease Maternal Grandfather   . Diabetes Paternal Grandfather     Social History    Socioeconomic History  . Marital status: Single    Spouse name: Not on file  . Number of children: Not on file  . Years of education: Not on file  . Highest education level: Not on file  Occupational History  . Not on file  Tobacco Use  . Smoking status: Never Smoker  . Smokeless tobacco: Never Used  Vaping Use  . Vaping Use: Never used  Substance and Sexual Activity  . Alcohol use: No    Alcohol/week: 0.0 standard drinks  . Drug use: No  . Sexual activity: Yes    Partners: Male    Birth control/protection: None  Other Topics Concern  . Not on file  Social History Narrative  . Not on file   Social Determinants of Health   Financial Resource Strain:   . Difficulty of Paying Living Expenses:   Food Insecurity:   . Worried About Charity fundraiser in the Last Year:   . Arboriculturist in the Last Year:   Transportation Needs:   . Film/video editor (Medical):   Marland Kitchen Lack of Transportation (Non-Medical):   Physical Activity:   . Days  of Exercise per Week:   . Minutes of Exercise per Session:   Stress:   . Feeling of Stress :   Social Connections:   . Frequency of Communication with Friends and Family:   . Frequency of Social Gatherings with Friends and Family:   . Attends Religious Services:   . Active Member of Clubs or Organizations:   . Attends Archivist Meetings:   Marland Kitchen Marital Status:   Intimate Partner Violence:   . Fear of Current or Ex-Partner:   . Emotionally Abused:   Marland Kitchen Physically Abused:   . Sexually Abused:     Review of Systems  All other systems reviewed and are negative.   PHYSICAL EXAMINATION:    BP 110/64   Pulse 79   Ht 5' 2.5" (1.588 m)   Wt 153 lb (69.4 kg)   LMP 07/01/2019   SpO2 98%   BMI 27.54 kg/m     General appearance: alert, cooperative and appears stated age Neck: no adenopathy, supple, symmetrical, trachea midline and thyroid normal to inspection and palpation  Pelvic: External genitalia:  no lesions               Urethra:  normal appearing urethra with no masses, tenderness or lesions              Bartholins and Skenes: normal                 Vagina: normal appearing vagina with an increase in watery, frothy, gray vaginal discharge              Cervix:  no lesions  Chaperone was present for exam.  Wet prep: ++ clue, ? trich, +++ wbc KOH: no yeast PH: 5.5   ASSESSMENT Screening STD Late menses, some cycle irregularity Contraception, declines any contraception. She is okay if she gets pregnant, not actively trying BV, slides also concerning for possible trich (no movement)    PLAN UPT negative TSH, prolactin STD testing She should be on prenatal vitamins Treat with Flagyl, advised her not to have intercourse until her testing for trich is back. If she has trich she needs to avoid intercourse until one week after she and her partner have been treated Also advised her that when she does get pregnant she will need early evaluation to r/o ectopic pregnancy

## 2019-08-07 LAB — CHLAMYDIA/GONOCOCCUS/TRICHOMONAS, NAA
Chlamydia by NAA: POSITIVE — AB
Gonococcus by NAA: NEGATIVE
Trich vag by NAA: NEGATIVE

## 2019-08-07 LAB — HEP, RPR, HIV PANEL
HIV Screen 4th Generation wRfx: NONREACTIVE
Hepatitis B Surface Ag: NEGATIVE
RPR Ser Ql: NONREACTIVE

## 2019-08-07 LAB — HEPATITIS C ANTIBODY: Hep C Virus Ab: <0.1 s/co ratio (ref 0.0–0.9)

## 2019-08-07 LAB — TSH: TSH: 1.59 u[IU]/mL (ref 0.450–4.500)

## 2019-08-07 LAB — PROLACTIN: Prolactin: 34.8 ng/mL — ABNORMAL HIGH (ref 4.8–23.3)

## 2019-08-08 ENCOUNTER — Telehealth: Payer: Self-pay

## 2019-08-08 DIAGNOSIS — R7989 Other specified abnormal findings of blood chemistry: Secondary | ICD-10-CM

## 2019-08-08 MED ORDER — AZITHROMYCIN 250 MG PO TABS: 1000.0000 mg | ORAL_TABLET | Freq: Once | ORAL | 0 refills | Status: AC

## 2019-08-08 NOTE — Telephone Encounter (Signed)
Patient is returning a call to the office.

## 2019-08-08 NOTE — Telephone Encounter (Signed)
Chlamydia test is positive.  Please advised pt.  Ok to treat with azithromycin 1 gram po x 1 dose.  Ok to use even if pregnant.  Reviewed Up to Date recommendations.  Can offer expedited therapy to partner.  Repeat testing needed with Dr. Talbert Nan in 3 months.  CC:  Dr. Talbert Nan

## 2019-08-08 NOTE — Telephone Encounter (Signed)
Spoke with patient. Advised of all results as seen below.   Rx for azithromycin to verified pharmacy.  Lab appt scheduled for 08/12/19 at 8:45am. Advised nothing to eat or drink after midnight night before labs. Avoid intercourse or strenuous exercise night before and morning of labs.  OV scheduled for 10/29/19 at 4pm. Patient declines partner therapy.   Advised patient if unable to add beta hcg to labs drawn on 08/06/19, can have this drawn when she comes in on 8/10, our office will notify. Patient verbalizes understanding and is agreeable.   Confidential Communicable Disease report completed and faxed to [facility].   Encounter closed.

## 2019-08-08 NOTE — Telephone Encounter (Signed)
Dr. Sabra Heck -please review cervical cultures and advise.

## 2019-08-08 NOTE — Telephone Encounter (Signed)
-----   Message from Salvadore Dom, MD sent at 08/07/2019  6:30 PM EDT ----- Please let the patient know that her prolactin level is elevated. This could cause her to skip her cycle. She should return for a repeat prolactin level fasting. Her UPT was negative, but please add a non stat BhcG onto the blood work to be safe. The rest of her blood work is normal, her cervical cultures are pending.

## 2019-08-09 LAB — SPECIMEN STATUS REPORT

## 2019-08-09 LAB — BETA HCG QUANT (REF LAB): hCG Quant: <1 m[IU]/mL

## 2019-08-12 ENCOUNTER — Encounter: Payer: Self-pay | Admitting: Obstetrics and Gynecology

## 2019-08-12 ENCOUNTER — Other Ambulatory Visit: Payer: Commercial Managed Care - PPO

## 2019-08-12 ENCOUNTER — Other Ambulatory Visit: Payer: Self-pay

## 2019-08-12 DIAGNOSIS — R7989 Other specified abnormal findings of blood chemistry: Secondary | ICD-10-CM

## 2019-08-13 ENCOUNTER — Telehealth: Payer: Self-pay | Admitting: *Deleted

## 2019-08-13 DIAGNOSIS — R7989 Other specified abnormal findings of blood chemistry: Secondary | ICD-10-CM

## 2019-08-13 LAB — PROLACTIN: Prolactin: 57.2 ng/mL — ABNORMAL HIGH (ref 4.8–23.3)

## 2019-08-13 NOTE — Telephone Encounter (Signed)
-----   Message from Salvadore Dom, MD sent at 08/13/2019  9:45 AM EDT ----- Please let the patient know that her prolactin level is high and set her up for a MRI of her pituitary. Let her know that sometimes a benign tumor in the brain can cause the prolactin level to elevate, this can typically be treated with medication.

## 2019-08-13 NOTE — Telephone Encounter (Signed)
Burnice Logan, RN  08/13/2019 10:20 AM EDT Back to Top    Left message to call Sharee Pimple, RN at [facility].      Order for MRI brain w/wo contrast pended.  Placed in [place] hold

## 2019-08-13 NOTE — Telephone Encounter (Signed)
Spoke with patient, advised per Dr. Talbert Nan. Patient verbalizes understanding and is agreeable, request to proceed with MRI. Order placed. Advised patient [facility] will contact her directly to schedule, our office will precert once scheduled. Patient agreeable.   [facility] hold.   Routing to [employer] for [employer].   Encounter closed.

## 2019-08-13 NOTE — Telephone Encounter (Signed)
Patient returning call. Patient stated to please return call around 2:30pm.

## 2019-08-13 NOTE — Telephone Encounter (Signed)
Left message to call Safal Halderman, RN at [facility] [phone].   

## 2019-09-09 ENCOUNTER — Ambulatory Visit
Admission: RE | Admit: 2019-09-09 | Discharge: 2019-09-09 | Disposition: A | Discharge status: Home / Self Care | Payer: Commercial Managed Care - PPO | Source: Ambulatory Visit | Attending: Obstetrics and Gynecology | Admitting: Obstetrics and Gynecology

## 2019-09-09 ENCOUNTER — Other Ambulatory Visit: Payer: Self-pay

## 2019-09-09 DIAGNOSIS — R7989 Other specified abnormal findings of blood chemistry: Secondary | ICD-10-CM

## 2019-09-09 MED ORDER — GADOBENATE DIMEGLUMINE 529 MG/ML IV SOLN
7.0000 mL | Freq: Once | INTRAVENOUS | Status: AC | PRN
  Administered 2019-09-09: 7 mL via INTRAVENOUS

## 2019-09-10 ENCOUNTER — Other Ambulatory Visit: Payer: Self-pay | Admitting: Obstetrics and Gynecology

## 2019-09-10 DIAGNOSIS — R7989 Other specified abnormal findings of blood chemistry: Secondary | ICD-10-CM

## 2019-09-10 DIAGNOSIS — D369 Benign neoplasm, unspecified site: Secondary | ICD-10-CM | POA: Insufficient documentation

## 2019-09-11 ENCOUNTER — Other Ambulatory Visit: Payer: Self-pay

## 2019-09-11 ENCOUNTER — Ambulatory Visit (INDEPENDENT_AMBULATORY_CARE_PROVIDER_SITE_OTHER): Payer: Commercial Managed Care - PPO | Admitting: Obstetrics and Gynecology

## 2019-09-11 ENCOUNTER — Encounter: Payer: Self-pay | Admitting: Obstetrics and Gynecology

## 2019-09-11 ENCOUNTER — Telehealth: Payer: Self-pay

## 2019-09-11 ENCOUNTER — Telehealth: Payer: Self-pay | Admitting: Obstetrics and Gynecology

## 2019-09-11 VITALS — BP 108/60 | HR 78 | Ht 62.5 in | Wt 155.0 lb

## 2019-09-11 DIAGNOSIS — D369 Benign neoplasm, unspecified site: Secondary | ICD-10-CM

## 2019-09-11 DIAGNOSIS — Z8742 Personal history of other diseases of the female genital tract: Secondary | ICD-10-CM | POA: Diagnosis not present

## 2019-09-11 DIAGNOSIS — Z8619 Personal history of other infectious and parasitic diseases: Secondary | ICD-10-CM | POA: Diagnosis not present

## 2019-09-11 DIAGNOSIS — R7989 Other specified abnormal findings of blood chemistry: Secondary | ICD-10-CM

## 2019-09-11 NOTE — Patient Instructions (Signed)
Chlamydia, Female Chlamydia is an STD (sexually transmitted disease). It is a bacterial infection that spreads (is contagious) through sexual contact. Chlamydia can occur in different areas of the body, including:  The tube that moves urine from the bladder out of the body (urethra).  The lower part of the uterus (cervix).  The throat.  The rectum. This condition is not difficult to treat. However, if left untreated, chlamydia can lead to more serious health problems, including pelvic inflammatory disorder (PID). PID can increase your risk of not being able to have children (sterility). Also, if chlamydia is left untreated and you are pregnant or become pregnant, there is a chance that your baby can become infected during delivery. This may cause serious health problems for the baby. What are the causes? Chlamydia is caused by the bacteria Chlamydia trachomatis. It is passed from an infected partner during sexual activity. Chlamydia can spread through contact with the genitals, mouth, or rectum. What are the signs or symptoms? In some cases, there may not be any symptoms for this condition (asymptomatic), especially early in the infection. If symptoms develop, they may include:  Burning with urination.  Frequent urination.  Vaginal discharge.  Redness, soreness, and swelling (inflammation) of the rectum.  Bleeding or discharge from the rectum.  Abdominal pain.  Pain during sexual intercourse.  Bleeding between menstrual periods.  Itching, burning, or redness in the eyes, or discharge from the eyes. How is this diagnosed? This condition may be diagnosed with:  Urine tests.  Swab tests. Depending on your symptoms, your health care provider may use a cotton swab to collect discharge from your vagina or rectum to test for the bacteria.  A pelvic exam. How is this treated? This condition is treated with antibiotic medicines. If you are pregnant, certain types of antibiotics will  need to be avoided. Follow these instructions at home: Medicines  Take over-the-counter and prescription medicines only as told by your health care provider.  Take your antibiotic medicine as told by your health care provider. Do not stop taking the antibiotic even if you start to feel better. Sexual activity  Tell sexual partners about your infection. This includes any oral, anal, or vaginal sex partners you have had within 60 days of when your symptoms started. Sexual partners should also be treated, even if they have no signs of the disease.  Do not have sex until you and your sexual partners have completed treatment and your health care provider says it is okay. If your health care provider prescribed you a single dose treatment, wait 7 days after taking the treatment before having sex. General instructions  It is your responsibility to get your test results. Ask your health care provider, or the department performing the test, when your results will be ready.  Get plenty of rest.  Eat a healthy, well-balanced diet.  Drink enough fluids to keep your urine clear or pale yellow.  Keep all follow-up visits as told by your health care provider. This is important. You may need to be tested for infection again 3 months after treatment. How is this prevented? The only sure way to prevent chlamydia is to avoid having sex. However, you can lower your risk by:  Using latex condoms correctly every time you have sex.  Not having multiple sexual partners.  Asking if your sexual partner has been tested for STIs and had negative results. Contact a health care provider if:  You develop new symptoms or your symptoms do not get  better after completing treatment.  You have a fever or chills.  You have pain during sexual intercourse. Get help right away if:  Your pain gets worse and does not get better with medicine.  You develop flu-like symptoms, such as night sweats, sore throat, or  muscle aches.  You experience nausea or vomiting.  You have difficulty swallowing.  You have bleeding between periods or after sex.  You have irregular menstrual periods.  You have abdominal or lower back pain that does not get better with medicine.  You feel weak or dizzy, or you faint.  You are pregnant and you develop symptoms of chlamydia. Summary  Chlamydia is an STD (sexually transmitted disease). It is a bacterial infection that spreads (is contagious) through sexual contact.  This condition is not difficult to treat, however. If left untreated, chlamydia can lead to more serious health problems, including pelvic inflammatory disease (PID).  In some cases, there may not be any symptoms for this condition (asymptomatic).  This condition is treated with antibiotic medicines.  Using latex condoms correctly every time you have sex can help prevent chlamydia. This information is not intended to replace advice given to you by your health care provider. Make sure you discuss any questions you have with your health care provider. Document Revised: 06/12/2017 Document Reviewed: 12/06/2015 Elsevier Patient Education  [place].

## 2019-09-11 NOTE — Telephone Encounter (Signed)
Referral placed for Dr Cruzita Lederer for Endocrinology.   Routing to [place] for referral update

## 2019-09-11 NOTE — Progress Notes (Signed)
GYNECOLOGY  VISIT   HPI: 24 y.o.   Single White or Caucasian Not Hispanic or Latino  female   [id number] with Patient's last menstrual period was 08/17/2019.   here for follow up BV testing.    She was treated for chlamydia last month as well as BV.  She is wanting retesting for BV, no current symptoms. She is here with her Mother who has questions about BV and patients recent diagnosis of a microadenoma. Patient with irregular menses, elevated prolactin level and recent MRI with prolactinoma, referral has been placed with Neurology.   GYNECOLOGIC HISTORY: Patient's last menstrual period was 08/17/2019. Contraception:none  Menopausal hormone therapy: none         OB History    Gravida  1   Para      Term      Preterm      AB  1   Living        SAB  1   TAB      Ectopic      Multiple      Live Births                 Patient Active Problem List   Diagnosis Date Noted  . Microadenoma 09/10/2019  . History of chlamydia 09/11/2018  . Heart murmur     Past Medical History:  Diagnosis Date  . Dysmenorrhea   . Heart murmur   . STD (sexually transmitted disease)     Past Surgical History:  Procedure Laterality Date  . WISDOM TOOTH EXTRACTION  10/2015    No current outpatient medications on file.   No current facility-administered medications for this visit.     ALLERGIES: Patient has no known allergies.  Family History  Problem Relation Age of Onset  . Breast cancer Maternal Grandmother   . Heart disease Maternal Grandmother   . Heart disease Maternal Grandfather   . Diabetes Paternal Grandfather     Social History   Socioeconomic History  . Marital status: Single    Spouse name: Not on file  . Number of children: Not on file  . Years of education: Not on file  . Highest education level: Not on file  Occupational History  . Not on file  Tobacco Use  . Smoking status: Never Smoker  . Smokeless tobacco: Never Used  Vaping Use  . Vaping  Use: Never used  Substance and Sexual Activity  . Alcohol use: No    Alcohol/week: 0.0 standard drinks  . Drug use: No  . Sexual activity: Yes    Partners: Male    Birth control/protection: None  Other Topics Concern  . Not on file  Social History Narrative  . Not on file   Social Determinants of Health   Financial Resource Strain:   . Difficulty of Paying Living Expenses: Not on file  Food Insecurity:   . Worried About Charity fundraiser in the Last Year: Not on file  . Ran Out of Food in the Last Year: Not on file  Transportation Needs:   . Lack of Transportation (Medical): Not on file  . Lack of Transportation (Non-Medical): Not on file  Physical Activity:   . Days of Exercise per Week: Not on file  . Minutes of Exercise per Session: Not on file  Stress:   . Feeling of Stress : Not on file  Social Connections:   . Frequency of Communication with Friends and Family: Not on file  . Frequency  of Social Gatherings with Friends and Family: Not on file  . Attends Religious Services: Not on file  . Active Member of Clubs or Organizations: Not on file  . Attends Archivist Meetings: Not on file  . Marital Status: Not on file  Intimate Partner Violence:   . Fear of Current or Ex-Partner: Not on file  . Emotionally Abused: Not on file  . Physically Abused: Not on file  . Sexually Abused: Not on file    Review of Systems  All other systems reviewed and are negative.   PHYSICAL EXAMINATION:    BP 108/60   Pulse 78   Ht 5' 2.5" (1.588 m)   Wt 155 lb (70.3 kg)   LMP 08/17/2019   SpO2 100%   BMI 27.90 kg/m     General appearance: alert, cooperative and appears stated age  Pelvic: External genitalia:  no lesions              Urethra:  normal appearing urethra with no masses, tenderness or lesions              Bartholins and Skenes: normal                 Vagina: normal appearing vagina with normal color and discharge, no lesions              Cervix: no  lesions  Chaperone was present for exam.  ASSESSMENT H/O BV 2 x this year, treated last month and symptoms are currently gone Diagnosed and treated for chlamydia last month Elevated prolactin level, just diagnosed with microadenoma on MRI 2 days ago    PLAN Discussed BV, reviewed that it is not recommended to treat asymptomatic BV. I wouldn't recommend checking for BV today. If she has a 3rd episode of BV within a year, I would recommend suppression Discussed that condom use can help reduce the rate of BV recurrence Nuswab sent for GC/CT Discussed the risks of tubal scarring and ectopic pregnancy after chlamydia infection. Discussed the need for early monitoring in a future pregnancy.  Discussed microadenoma, typical treatment Discussed that her cycles will hopefully normalize on medication for the microadenoma. Also discussed OCP's or cyclic provera    An After Visit Summary was printed and given to the patient.  26 minutes spent in total patient care.

## 2019-09-11 NOTE — Telephone Encounter (Signed)
Left pt detailed message about appt today at 430 pm. Pt to return call if any questions or concerns.  Encounter closed.

## 2019-09-11 NOTE — Telephone Encounter (Signed)
Yolanda Villa from [facility] Neurology calling in reference to the referral for Elevated prolactin level and Microadenoma. The provider has reviewed the referral and states this patient should be seen by Endocrinology. Can you place a new order for Endocrinology?

## 2019-09-11 NOTE — Telephone Encounter (Signed)
Left message to call Lovett Coffin, RN at [facility] [phone].   

## 2019-09-11 NOTE — Telephone Encounter (Signed)
Patient is calling in regards to speaking with [facility]. Patient stated it is about an upcoming appointment.

## 2019-09-14 LAB — GC/CHLAMYDIA PROBE AMP
Chlamydia trachomatis, NAA: NEGATIVE
Neisseria Gonorrhoeae by PCR: NEGATIVE

## 2019-10-22 ENCOUNTER — Telehealth: Payer: Self-pay

## 2019-10-22 NOTE — Telephone Encounter (Signed)
Patient cancelled 3 month TOC because of work and will call back to reschedule.

## 2019-10-28 ENCOUNTER — Other Ambulatory Visit: Payer: Self-pay

## 2019-10-29 ENCOUNTER — Ambulatory Visit: Payer: Self-pay | Admitting: Obstetrics and Gynecology

## 2019-10-31 ENCOUNTER — Ambulatory Visit (INDEPENDENT_AMBULATORY_CARE_PROVIDER_SITE_OTHER): Payer: Commercial Managed Care - PPO | Admitting: Internal Medicine

## 2019-10-31 ENCOUNTER — Encounter: Payer: Self-pay | Admitting: Internal Medicine

## 2019-10-31 ENCOUNTER — Other Ambulatory Visit: Payer: Self-pay

## 2019-10-31 VITALS — BP 110/70 | HR 86 | Ht 62.0 in | Wt 156.4 lb

## 2019-10-31 DIAGNOSIS — E221 Hyperprolactinemia: Secondary | ICD-10-CM | POA: Diagnosis not present

## 2019-10-31 DIAGNOSIS — D352 Benign neoplasm of pituitary gland: Secondary | ICD-10-CM

## 2019-10-31 NOTE — Patient Instructions (Addendum)
Please come back fasting, at 8-9 am, in the 3rd-5th day of your menstrual cycle.  Please come back for a follow-up appointment in 6 months.   Pituitary Tumors  Pituitary tumors are abnormal growths found in the pituitary gland. The pituitary gland is a small organ in the center of the brain. It makes hormones that affect growth and the functions of other glands in the body. In most cases, pituitary tumors grow slowly, are not cancerous (benign), and do not spread to other parts of the body. These tumors are best treated when they are found and diagnosed early. A pituitary tumor may produce hormones (functioning tumor) or not (non-functioning tumor). A pituitary tumor may cause:  Cushing disease. In this disease, the pituitary gland produces too much of a hormone called cortisol. This causes fat to build up in the face, back, and chest while the arms and legs become thin.  Acromegaly. This is a condition in which the hands, feet, and face are larger than normal.  Breast milk production, even when there is no pregnancy. What are the causes? The cause of most pituitary tumors is not known. In some cases, pituitary tumors may be passed from parent to child (inherited). What increases the risk? You are more likely to develop this condition if:  You have a family history of pituitary tumors.  You have certain syndromes caused by unwanted changes (mutations) in your genes. What are the signs or symptoms? Symptoms of this condition include:  Headaches.  Loss of consciousness.  Vision problems and eye muscle weakness.  Weakness or low energy.  Clear fluid draining from the nose.  Changes in the sense of smell.  Loss of body hair.  Nausea and vomiting.  Problems caused by the production of too many hormones, such as: ? Inability to get pregnant after a year of having sex regularly without using birth control (infertility). ? Loss of menstrual periods in women. ? Abnormal  growth. ? Diabetes insipidus or diabetes mellitus. ? High blood pressure (hypertension). ? Inability to tolerate heat or cold. ? Increase in sweating. ? Joint pain. ? Other skin and body changes. ? Nipple discharge. ? Changes in mood, or depression. ? Decreased sexual function. How is this diagnosed? This condition may be diagnosed based on:  Your symptoms.  Your medical history.  Blood or urine tests to check your hormone levels.  CT scan.  MRI.  Removal and examination of a small tumor tissue (biopsy). How is this treated? Treatment depends on the type of pituitary tumor you have and your overall health. Treatments may include:  Surgical removal of the tumor.  Using high doses of X-ray energy to kill tumor cells (radiation).  Using certain medicines to stop the pituitary gland from producing too many hormones (drug therapy). If you have a family history of pituitary tumors, you may need to have regular blood tests to monitor pituitary hormone levels. Follow these instructions at home:  Drink enough fluid to keep your urine clear or pale yellow.  If directed, follow instructions from your health care provider about measuring how much urine you [place].  Do not pick your nose or remove any crusting, if your nose is draining clear fluid. Tell your health care provider if this condition worsens.  Do not do any activities that require straining, such as heavy lifting. Ask your health care provider what activities are safe for you.  Take over-the-counter and prescription medicines only as told by your health care provider.  Keep all  follow-up visits as told by your health care provider. This is important, especially if you have a family history of pituitary tumors and you need regular blood tests. Contact a health care provider if:  You have sudden, unusual thirst.  You are urinating more often than usual.  You have a headache that does not go away.  You develop new  changes in your vision.  You have clear fluid leaking from your nose or ears.  You have a sensation of fluid trickling down the back of your throat.  You have a salty taste in your mouth.  You have trouble concentrating. Get help right away if:  Your symptoms suddenly become severe.  You have a nosebleed that does not stop after a few minutes.  You have a fever of over 101F (38.3C).  You have a severe headache.  You have a stiff neck.  You are confused or not as alert as usual.  You have chest pain.  You have shortness of breath. Summary  Pituitary tumors are abnormal growths found in the pituitary gland.  Treatment depends on the type of pituitary tumor you have and your overall health.  Keep all follow-up visits as told by your health care provider. This is important, especially if you have a family history of pituitary tumors and you need regular blood tests. This information is not intended to replace advice given to you by your health care provider. Make sure you discuss any questions you have with your health care provider. Document Revised: 06/03/2018 Document Reviewed: 03/07/2016 Elsevier Patient Education  [place].

## 2019-10-31 NOTE — Progress Notes (Signed)
Patient ID: Yolanda Villa, female   DOB: May 11, 1995, 24 y.o.   MRN: [id number]   This visit occurred during the SARS-CoV-2 public health emergency.  Safety protocols were in place, including screening questions prior to the visit, additional usage of staff PPE, and extensive cleaning of exam room while observing appropriate contact time as indicated for disinfecting solutions.   HPI  Yolanda Villa is a 24 y.o.-year-old female, referred by her OB/GYN doctor, Dr. 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  She is here with her mother who offers part of the history especially related to patient's symptoms, medical history, and family history.  Pt. has been found to have a high prolactin level in 08/2019 -this was checked after she missed 1 menstrual cycle in 07/2019.  She describes that she had menarche at 24 y/o.  After this, she developed  heavy menses and dysmenorrhea >> started OCPs >> helped.  However, she stopped OCPs at 24 y/o >> did not have any menses for 1.5 years >> restarted OCPs at 24 y/o >> stopped again at 24 y/o >> regular menses afterwards, until her skipped menstrual cycle in 07/2019.  However, afterwards, she resumed monthly menses. LMP: 10/10/2019.  She is not on Risperdal, oral contraceptives, Reglan.  Further investigation with a pituitary MRI showed a possible pituitary microadenoma: Pituitary MRI (09/09/2019):  Dedicated imaging was performed through the sella turcica. The pituitary gland is overall normal in size, and there is a normal posterior pituitary bright spot. Pituitary enhancement is mildly heterogeneous, however there is the suggestion of an underlying lesion in the midline of the gland both on pre and postcontrast thin coronal images. Precise measurement of the lesion's size is limited as its margins are not well-defined (potentially as large as 7 mm on the delayed thin coronal postcontrast sequence (series 22, image 5) though with the  precontrast T1 sequence, whole brain coronal T1 postcontrast sequence, and lack of significant bulging of the superior pituitary contour more suggestive of a smaller underlying lesion (5 mm or less). Characterization on dynamic postcontrast sequences is limited by motion. There is no regional mass effect, and the infundibulum is midline. The cavernous sinuses and optic chiasm are unremarkable.  Patient prolactin levels were reviewed: Lab Results  Component Value Date   PROLACTIN 57.2 (H) 08/12/2019   PROLACTIN 34.8 (H) 08/06/2019   PROLACTIN 12.2 07/17/2017   PROLACTIN 7.2 03/26/2014   PROLACTIN 5.8 03/29/2012   Patient's thyroid tests were reviewed and these were normal: Lab Results  Component Value Date   TSH 1.590 08/06/2019   TSH 2.360 07/17/2017   TSH 1.550 04/25/2017   TSH 1.454 03/26/2014   TSH 0.731 03/29/2012   She denies: -Galactorrhea -Headaches -Vision problems -Weight gain -Increase in size of hands or shoe size -Acne -Hirsutism  But she does have: -Breast tenderness   No other medical hx other than a heart murmur and also BV.  Per mother's report: No family history of pituitary tumors.  Also, no family history of irregular menstrual cycles or infertility.  ROS: + See HPI, also: Constitutional: no weight gain, no weight loss, no fatigue, no subjective hyperthermia, no subjective hypothermia, no nocturia Eyes: no blurry vision, no xerophthalmia ENT: no sore throat, no nodules felt in neck, no dysphagia, no odynophagia, no hoarseness, + tinnitus, no hypoacusis Cardiovascular: no CP, + SOB with exertion, no palpitations, no leg swelling Respiratory: no cough, + SOB with exertion, no wheezing Gastrointestinal: no N, no V, no D, no C,  no acid reflux Musculoskeletal: no muscle, no joint aches Skin: no rash, no hair loss Neurological: no tremors, no numbness or tingling/no dizziness/no HAs Psychiatric: no depression, no anxiety  Past Medical History:   Diagnosis Date  . Dysmenorrhea   . Heart murmur   . STD (sexually transmitted disease)    Past Surgical History:  Procedure Laterality Date  . WISDOM TOOTH EXTRACTION  10/2015   Social History   Socioeconomic History  . Marital status: Single    Spouse name: Not on file  . Number of children: 0  . Years of education: Not on file  . Highest education level: Not on file  Occupational History  . Occupation: Glass blower/designer  Tobacco Use  . Smoking status: Never Smoker  . Smokeless tobacco: Never Used  Vaping Use  . Vaping Use: Never used  Substance and Sexual Activity  . Alcohol use: No    Alcohol/week: 0.0 standard drinks  . Drug use: No  . Sexual activity: Yes    Partners: Male    Birth control/protection: None  Other Topics Concern  . Not on file  Social History Narrative  . Not on file   Social Determinants of Health   Financial Resource Strain:   . Difficulty of Paying Living Expenses: Not on file  Food Insecurity:   . Worried About Charity fundraiser in the Last Year: Not on file  . Ran Out of Food in the Last Year: Not on file  Transportation Needs:   . Lack of Transportation (Medical): Not on file  . Lack of Transportation (Non-Medical): Not on file  Physical Activity:   . Days of Exercise per Week: Not on file  . Minutes of Exercise per Session: Not on file  Stress:   . Feeling of Stress : Not on file  Social Connections:   . Frequency of Communication with Friends and Family: Not on file  . Frequency of Social Gatherings with Friends and Family: Not on file  . Attends Religious Services: Not on file  . Active Member of Clubs or Organizations: Not on file  . Attends Archivist Meetings: Not on file  . Marital Status: Not on file  Intimate Partner Violence:   . Fear of Current or Ex-Partner: Not on file  . Emotionally Abused: Not on file  . Physically Abused: Not on file  . Sexually Abused: Not on file   No current outpatient  medications on file prior to visit.   No current facility-administered medications on file prior to visit.   No Known Allergies Family History  Problem Relation Age of Onset  . Breast cancer Maternal Grandmother   . Heart disease Maternal Grandmother   . Heart disease Maternal Grandfather   . Diabetes Paternal Grandfather    PE: BP 110/70   Pulse 86   Ht 5\' 2"  (1.575 m)   Wt 156 lb 6.4 oz (70.9 kg)   SpO2 98%   BMI 28.61 kg/m  Wt Readings from Last 3 Encounters:  10/31/19 156 lb 6.4 oz (70.9 kg)  09/11/19 155 lb (70.3 kg)  08/06/19 153 lb (69.4 kg)   Constitutional: Slightly overweight, in NAD Eyes: PERRLA, EOMI, no exophthalmos ENT: moist mucous membranes, no thyromegaly, no cervical lymphadenopathy Cardiovascular: RRR, No MRG Respiratory: CTA B Gastrointestinal: abdomen soft, NT, ND, BS+ Musculoskeletal: no deformities, strength intact in all 4 Skin: moist, warm, no rashes Neurological: no tremor with outstretched hands, DTR normal in all 4  ASSESSMENT: 1.  Hyperprolactinemia  Patient with new diagnosis of hyperprolactinemia.  - I discussed with the patient and her mother about possible etiologies of high prolactin levels:  Pituitary microadenoma secreting prolactin, but also:  Pregnancy (we will check a beta-hCG level at next blood draw)  Hypothyroidism   Stress   Exercise   Lack of sleep   Medications (she is not taking psychotropic medications or Reglan).  Drugs (denies)  Chest wall lesions (denies)  Seizures ( no history of ~)  Liver ds (no history of ~)  Kidney disease (no history of ~)  A teratoma containing pituitary cells that can develop into prolactinoma (very rarely)  Macroprolactin (multimeric prolactin, especially in patients without galactorrhea)  idiopathic - high prolactin most frequently impacts menstrual cycles - we will recheck prolactin in approximately 1 week, in the 3rd-5th day of her menstrual cycle along with the rest of  her pituitary labs - We discussed about possible treatment if prolactin remains elevated.  We have 2 options for treatment: Cabergoline and bromocriptine.  Cabergoline has the advantage of being administered usually 1-2 times a week, is better tolerated, and has beneficial effects on pituitary tumor size.  Bromocriptine is taken several times a day, has more side effects, and has no effect on pituitary tumor size.  She agrees with the plan to start cabergoline if still needed - RTC for repeat prolactin level in 2 months if we start cabergoline, and in 6 months for another visit  2. Pituitary microadenoma - Patient with a newly diagnosed pituitary microadenoma, incidentally found during investigation for high prolactin level. - I reviewed the pituitary MRI images and report along with the patient and her mother. I explained that, since the tumor is under 1 cm, this qualifies as a micro-, rather than a macro-adenoma.  - I explained that this is not a "brain tumor". These tumors are extremely rarely malignant, the vast majority of them are benign.  - the pituitary adenoma can be:  Not producing hormones, not compressing the pituitary gland or the optic chiasm  Not producing hormones, but compressing either the pituitary gland (causing hypopituitarism) or the optic chiasm (causing visual field cuts)  Producing hormones: - Prolactin (prolactinoma) -usually manifests with galactorrhea, high prolactin level and irregular menses.  She does have high prolactin level and irregular menstrual cycles, but no galactorrhea.  She has some fullness in her breasts, which could be related to menses. - ACTH (Cushing's disease) -usually manifests with weight gain-with specific central distribution of fat, wide, purple, stretch marks, full supraclavicular fat pads, diabetes, hypertension.  She does not have any of these. - growth hormone (acromegaly) - she denies an enlarged jaw, change in the size of rings or changing  shoe sizes - LH or FSH (gonadotropin secreting tumor) -usually manifests with irregular menses in women, hyper/hypogonadism in men - TSH (TSH secreting tumor) (very rare) -but recent TSH normal, although we will recheck a complete panel at next lab draw - Her prolactin level was elevated which points towards a prolactin producing tumor, however, I would like to check the rest of her pituitary labs to make sure there is no concomitant hormonal dysfunction: Orders Placed This Encounter  Procedures  . Prolactin  . Insulin-like growth factor  . ACTH  . Cortisol  . Beta hCG quant (ref lab)  . Luteinizing hormone  . Follicle stimulating hormone  . Estradiol  . TSH  . T4, free  . T3, free  -I will be in touch with patient after the above results  return  Component     Latest Ref Rng & Units 11/10/2019  Estradiol, Free     pg/mL 0.73  Estradiol     pg/mL 28  IGF-I, LC/MS     83 - 456 ng/mL 374  Z-Score (Female)     -2.0 - 2 SD 1.4  TSH     0.35 - 4.50 uIU/mL 1.43  hCG Quant     mIU/mL <1  Prolactin     ng/mL 8.7  FSH     mIU/ML 6.2  Triiodothyronine,Free,Serum     2.3 - 4.2 pg/mL 3.6  T4,Free(Direct)     0.60 - 1.60 ng/dL 0.80  LH     mIU/mL 6.05  Cortisol, Plasma     ug/dL 8.6  C206 ACTH     6 - 50 pg/mL 9   All labs are normal, including the prolactin.  We will repeat an prolactin in 6 months.  Philemon Kingdom, MD PhD [facility] Endocrinology

## 2019-11-10 ENCOUNTER — Other Ambulatory Visit (INDEPENDENT_AMBULATORY_CARE_PROVIDER_SITE_OTHER): Payer: Commercial Managed Care - PPO

## 2019-11-10 ENCOUNTER — Other Ambulatory Visit: Payer: Self-pay

## 2019-11-10 ENCOUNTER — Encounter: Payer: Self-pay | Admitting: Internal Medicine

## 2019-11-10 DIAGNOSIS — D352 Benign neoplasm of pituitary gland: Secondary | ICD-10-CM

## 2019-11-10 DIAGNOSIS — E221 Hyperprolactinemia: Secondary | ICD-10-CM

## 2019-11-10 LAB — T4, FREE: Free T4: 0.8 ng/dL (ref 0.60–1.60)

## 2019-11-10 LAB — CORTISOL: Cortisol, Plasma: 8.6 ug/dL

## 2019-11-10 LAB — T3, FREE: T3, Free: 3.6 pg/mL (ref 2.3–4.2)

## 2019-11-10 LAB — LUTEINIZING HORMONE: LH: 6.05 m[IU]/mL

## 2019-11-10 LAB — FOLLICLE STIMULATING HORMONE: FSH: 6.2 m[IU]/mL

## 2019-11-10 LAB — TSH: TSH: 1.43 u[IU]/mL (ref 0.35–4.50)

## 2019-11-11 LAB — BETA HCG QUANT (REF LAB): hCG Quant: <1 m[IU]/mL

## 2019-11-15 LAB — INSULIN-LIKE GROWTH FACTOR
IGF-I, LC/MS: 374 ng/mL (ref 83–456)
Z-Score (Female): 1.4 SD (ref ?–2.0)

## 2019-11-15 LAB — ESTRADIOL, FREE
Estradiol, Free: 0.73 pg/mL
Estradiol: 28 pg/mL

## 2019-11-15 LAB — ACTH: C206 ACTH: 9 pg/mL (ref 6–50)

## 2019-11-15 LAB — PROLACTIN: Prolactin: 8.7 ng/mL

## 2020-04-30 ENCOUNTER — Ambulatory Visit: Payer: Commercial Managed Care - PPO | Admitting: Internal Medicine

## 2020-07-07 ENCOUNTER — Ambulatory Visit: Payer: Commercial Managed Care - PPO | Admitting: Internal Medicine

## 2020-09-13 ENCOUNTER — Other Ambulatory Visit: Payer: Self-pay

## 2020-09-13 ENCOUNTER — Encounter: Payer: Self-pay | Admitting: Nurse Practitioner

## 2020-09-13 ENCOUNTER — Ambulatory Visit: Payer: 59 | Admitting: Nurse Practitioner

## 2020-09-13 VITALS — BP 116/68

## 2020-09-13 DIAGNOSIS — Z86018 Personal history of other benign neoplasm: Secondary | ICD-10-CM | POA: Diagnosis not present

## 2020-09-13 DIAGNOSIS — N760 Acute vaginitis: Secondary | ICD-10-CM

## 2020-09-13 DIAGNOSIS — N898 Other specified noninflammatory disorders of vagina: Secondary | ICD-10-CM | POA: Diagnosis not present

## 2020-09-13 DIAGNOSIS — N912 Amenorrhea, unspecified: Secondary | ICD-10-CM

## 2020-09-13 DIAGNOSIS — B9689 Other specified bacterial agents as the cause of diseases classified elsewhere: Secondary | ICD-10-CM

## 2020-09-13 LAB — WET PREP FOR TRICH, YEAST, CLUE

## 2020-09-13 LAB — PREGNANCY, URINE: Preg Test, Ur: NEGATIVE

## 2020-09-13 LAB — PROLACTIN: Prolactin: 18.7 ng/mL

## 2020-09-13 MED ORDER — METRONIDAZOLE 500 MG PO TABS: 500.0000 mg | ORAL_TABLET | Freq: Two times a day (BID) | ORAL | 0 refills | Status: DC

## 2020-09-13 NOTE — Progress Notes (Signed)
   Acute Office Visit  Subjective:    Patient ID: Yolanda Villa, female    DOB: July 02, 1995, 25 y.o.   MRN: [id number]   HPI 25 y.o. presents today for vaginal discharge and odor that started last week. She had a negative STD screen at [facility] 6 days ago. She is also 7 days late on her menses. She does have a history of elevated prolactin levels with MRI showing suspected pituitary microadenoma. Levels resolved on their own. Since prolactin normalized she has had normal cycles until this one.    Review of Systems  Constitutional: Negative.   Genitourinary:  Positive for menstrual problem and vaginal discharge.       Vaginal odor      Objective:    Physical Exam Constitutional:      Appearance: Normal appearance.  Genitourinary:    General: Normal vulva.     Vagina: Vaginal discharge present. No erythema.     Cervix: Normal.    BP 116/68   LMP 08/06/2020  Wt Readings from Last 3 Encounters:  10/31/19 156 lb 6.4 oz (70.9 kg)  09/11/19 155 lb (70.3 kg)  08/06/19 153 lb (69.4 kg)   Wet prep + clue cells UPT negative     Assessment & Plan:   Problem List Items Addressed This Visit   None Visit Diagnoses     Bacterial vaginosis    -  Primary   Relevant Medications   metroNIDAZOLE (FLAGYL) 500 MG tablet   Vaginal discharge       Relevant Orders   WET PREP FOR [place], YEAST, CLUE   Amenorrhea       Relevant Orders   Pregnancy, urine (Completed)   Prolactin   History of pituitary adenoma       Relevant Orders   Prolactin      Plan: Wet prep positive for clue cells - Flagyl 500 mg BID x 7 days. Negative UPT. Will check prolactin due to history of microadenoma of pituitary.     Tamela Gammon DNP, 4:35 PM 09/13/2020

## 2020-10-18 ENCOUNTER — Ambulatory Visit: Payer: 59 | Admitting: Family Medicine

## 2020-10-18 ENCOUNTER — Other Ambulatory Visit (HOSPITAL_COMMUNITY)
Admission: RE | Admit: 2020-10-18 | Discharge: 2020-10-18 | Disposition: A | Discharge status: Home / Self Care | Payer: 59 | Source: Ambulatory Visit | Attending: Family Medicine | Admitting: Family Medicine

## 2020-10-18 ENCOUNTER — Other Ambulatory Visit: Payer: Self-pay

## 2020-10-18 ENCOUNTER — Encounter: Payer: Self-pay | Admitting: Family Medicine

## 2020-10-18 VITALS — BP 100/59 | HR 75 | Temp 98.4°F | Ht 62.0 in | Wt 153.5 lb

## 2020-10-18 DIAGNOSIS — Z7689 Persons encountering health services in other specified circumstances: Secondary | ICD-10-CM

## 2020-10-18 DIAGNOSIS — E221 Hyperprolactinemia: Secondary | ICD-10-CM

## 2020-10-18 DIAGNOSIS — D352 Benign neoplasm of pituitary gland: Secondary | ICD-10-CM

## 2020-10-18 DIAGNOSIS — F411 Generalized anxiety disorder: Secondary | ICD-10-CM | POA: Insufficient documentation

## 2020-10-18 DIAGNOSIS — N912 Amenorrhea, unspecified: Secondary | ICD-10-CM

## 2020-10-18 DIAGNOSIS — Z113 Encounter for screening for infections with a predominantly sexual mode of transmission: Secondary | ICD-10-CM

## 2020-10-18 DIAGNOSIS — F320 Major depressive disorder, single episode, mild: Secondary | ICD-10-CM

## 2020-10-18 LAB — PREGNANCY, URINE: Preg Test, Ur: NEGATIVE

## 2020-10-18 MED ORDER — CITALOPRAM HYDROBROMIDE 20 MG PO TABS: 20.0000 mg | ORAL_TABLET | Freq: Every day | ORAL | 2 refills | Status: DC

## 2020-10-18 NOTE — Progress Notes (Signed)
New Patient Office Visit  Subjective:  Patient ID: Yolanda Villa, female    DOB: 10/03/1995  Age: 25 y.o. MRN: [id number]  CC:  Chief Complaint  Patient presents with   New Patient (Initial Visit)    HPI Yolanda Villa presents to establish care.   She reports symptoms of anxiety for about 5 years. She has noticed an increase in her symptoms over the last few months, especially at work. She feels easily agitated and irritable.   Her last cycle was 09/19/20. She reports that her cycle was expected a few days ago. She does report some breast tenderness and lower back pain. She would like to have screening for gonorrhea and chlamydia today as well.   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 This is managed by endocrinology. She had labs checked last month that were normal.   Depression screen [facility] 2/9 10/18/2020 02/01/2016  Decreased Interest 1 0  Down, Depressed, Hopeless 1 0  PHQ - 2 Score 2 0  Altered sleeping 3 -  Tired, decreased energy 2 -  Change in appetite 1 -  Feeling bad or failure about yourself  1 -  Trouble concentrating 0 -  Moving slowly or fidgety/restless 0 -  Suicidal thoughts 0 -  PHQ-9 Score 9 -  Difficult doing work/chores Very difficult -   GAD 7 : Generalized Anxiety Score 10/18/2020  Nervous, Anxious, on Edge 2  Control/stop worrying 3  Worry too much - different things 3  Trouble relaxing 3  Restless 1  Easily annoyed or irritable 3  Afraid - awful might happen 1  Total GAD 7 Score 16  Anxiety Difficulty Very difficult     Past Medical History:  Diagnosis Date   Anxiety    Dysmenorrhea    Heart murmur    STD (sexually transmitted disease)     Past Surgical History:  Procedure Laterality Date   WISDOM TOOTH EXTRACTION  10/2015   WISDOM TOOTH EXTRACTION  2017    Family History  Problem Relation Age of Onset   Hyperlipidemia Father    Depression Sister    Anxiety disorder Sister    Asthma  Brother    Arthritis Maternal Grandmother    Breast cancer Maternal Grandmother    Heart disease Maternal Grandmother    Hypertension Maternal Grandmother    Arthritis Maternal Grandfather    Heart disease Maternal Grandfather    Hypertension Maternal Grandfather    Hyperlipidemia Maternal Grandfather    Cancer Maternal Grandfather    Kidney disease Paternal Grandmother    Stroke Paternal Grandfather    Diabetes Paternal Grandfather     Social History   Socioeconomic History   Marital status: Single    Spouse name: Not on file   Number of children: 0   Years of education: 14   Highest education level: Associate degree: academic program  Occupational History   Occupation: Glass blower/designer  Tobacco Use   Smoking status: Never   Smokeless tobacco: Never  Vaping Use   Vaping Use: Never used  Substance and Sexual Activity   Alcohol use: No    Alcohol/week: 0.0 standard drinks   Drug use: No   Sexual activity: Yes    Partners: Male    Birth control/protection: None  Other Topics Concern   Not on file  Social History Narrative   Not on file   Social Determinants of Health   Financial Resource Strain: Not on file  Food Insecurity: Not on file  Transportation Needs: Not on file  Physical Activity: Not on file  Stress: Not on file  Social Connections: Not on file  Intimate Partner Violence: Not on file    ROS Review of Systems As per HPI.   Objective:   Today's Vitals: BP (!) 100/59   Pulse 75   Temp 98.4 F (36.9 C) (Temporal)   Ht 5\' 2"  (1.575 m)   Wt 153 lb 8 oz (69.6 kg)   BMI 28.08 kg/m   Physical Exam Vitals and nursing note reviewed.  Constitutional:      General: She is not in acute distress.    Appearance: She is not ill-appearing, toxic-appearing or diaphoretic.  HENT:     Head: Normocephalic and atraumatic.  Cardiovascular:     Rate and Rhythm: Normal rate and regular rhythm.     Heart sounds: Normal heart sounds. No murmur  heard. Pulmonary:     Effort: Pulmonary effort is normal. No respiratory distress.     Breath sounds: Normal breath sounds.  Musculoskeletal:     Right lower leg: No edema.     Left lower leg: No edema.  Skin:    General: Skin is warm and dry.  Neurological:     General: No focal deficit present.     Mental Status: She is alert and oriented to person, place, and time.  Psychiatric:        Mood and Affect: Mood normal.        Behavior: Behavior normal.        Thought Content: Thought content normal.        Judgment: Judgment normal.    Assessment & Plan:   Yolanda Villa was seen today for new patient (initial visit).  Diagnoses and all orders for this visit:  Depression, major, single episode, mild (HCC) PHQ score of 9 today, denies SI. Start celexa as below.  -     citalopram (CELEXA) 20 MG tablet; Take 1 tablet (20 mg total) by mouth daily.  Generalized anxiety disorder GAD score of 16 today. Starts celexa.  -     citalopram (CELEXA) 20 MG tablet; Take 1 tablet (20 mg total) by mouth daily.  Screening for STD (sexually transmitted disease) Declined HIV screening today.  -     Urine cytology ancillary only  Hyperprolactinemia (HCC) Pituitary microadenoma [facility]) Managed by endocrinology. Normal prolactin level last month.   Amenorrhea Negative urine pregnancy test today.  -     Pregnancy, urine  Encounter to establish care   Follow-up: Return in about 6 weeks (around 11/29/2020) for medication follow up.   The patient indicates understanding of these issues and agrees with the plan.  Gwenlyn Perking, FNP

## 2020-10-18 NOTE — Patient Instructions (Signed)

## 2020-10-20 LAB — URINE CYTOLOGY ANCILLARY ONLY
Chlamydia: NEGATIVE
Comment: NEGATIVE
Comment: NORMAL
Neisseria Gonorrhea: NEGATIVE

## 2020-10-22 ENCOUNTER — Encounter: Payer: Self-pay | Admitting: Family Medicine

## 2020-10-22 ENCOUNTER — Ambulatory Visit: Payer: 59 | Admitting: Family Medicine

## 2020-10-22 ENCOUNTER — Other Ambulatory Visit: Payer: Self-pay

## 2020-10-22 VITALS — BP 106/62 | HR 83 | Temp 98.5°F | Ht 62.0 in | Wt 155.0 lb

## 2020-10-22 DIAGNOSIS — N912 Amenorrhea, unspecified: Secondary | ICD-10-CM | POA: Diagnosis not present

## 2020-10-22 DIAGNOSIS — E221 Hyperprolactinemia: Secondary | ICD-10-CM

## 2020-10-22 NOTE — Patient Instructions (Signed)
Secondary Amenorrhea  Secondary amenorrhea occurs when a female who was previously having menstrual periods has not had them for 3-6 months. A menstrual period is the monthly shedding of the lining of the uterus. The lining of the uterus is made up of blood, tissue, fluid, and mucus. The flow of blood usually occurs during 3-7consecutive days each month. This condition has many causes. In many cases, treating the underlying causewill return menstrual periods back to a normal cycle. What are the causes? The most common cause of this condition is pregnancy. Other medical conditions that can cause secondary amenorrhea include: Cirrhosis of the liver. Conditions of the blood. Diabetes. Epilepsy. Chronic kidney disease. Polycystic ovary disease. A hormonal imbalance. Ovarian failure. Cystic fibrosis. Early menopause. Cushing syndrome. Thyroid problems. Other causes may include: Malnutrition. Stress or anxiety. Medicines. Extreme obesity. Low body weight or drastic weight loss. Removal of the ovaries or uterus. Contraceptive pills, patches, or vaginal rings. What increases the risk? You are more likely to develop this condition if: You have a family history of this condition. You have an eating disorder. You do extreme athletic training. You have a chronic disease. You abuse substances such as alcohol or cigarettes. What are the signs or symptoms? The main symptom of this condition is a lack of menstrual periods for 3-6months in a female who previously had menstrual periods. How is this diagnosed? This condition may be diagnosed based on: Your medical history. A physical exam. A pelvic exam to check for problems with your reproductive organs. A procedure to examine the uterus. A measurement of your body mass index (BMI). You may also have other tests, including: Blood tests that measure certain hormones in your body and rule out pregnancy. Urine tests. Imaging tests, such as an  ultrasound, CT scan, or MRI. How is this treated? Treatment for this condition depends on the cause of the amenorrhea. It may involve: Correcting diet-related problems. Treating underlying conditions. Medicines. Lifestyle changes. Surgery. If the condition cannot be corrected, it is sometimes possible to startmenstrual periods with medicines. Follow these instructions at home: Lifestyle     Maintain a healthy diet. In general, a healthy diet includes lots of fruits and vegetables, low-fat dairy products, lean meats, and foods that contain fiber. Ask to meet with a registered dietitian for nutrition counseling and meal planning. Maintain a healthy weight. Talk to your health care provider before trying any new diet or exercise plan. Exercise at least 30 minutes 5 or more days each week. Exercising includes brisk walking, yard work, biking, running, swimming, and team sports like basketball and soccer. Ask your health care provider which exercises are safe for you. Get enough sleep. Plan your sleep time to allow for 7-9 hours of sleep each night. Learn to manage stress. Explore relaxation techniques such as meditation, journaling, yoga, or tai chi. General instructions Be aware of changes in your menstrual cycle. Keep a record of when you have your menstrual period. Note the date your period starts, how long it lasts, and any problems you experience. Take over-the-counter and prescription medicines only as told by your health care provider. Keep all follow-up visits. This is important. Contact a health care provider if: Your periods do not return to normal after treatment. Summary Secondary amenorrhea is when a female who was previously having menstrual periods has not gotten her period for 3-6 months. This condition has many causes. In many cases, treating the underlying cause will return menstrual periods back to a normal cycle. Talk to   to your health care provider if your periods do not  return to normal after treatment. This information is not intended to replace advice given to you by your health care provider. Make sure you discuss any questions you have with your health care provider. Document Revised: 08/06/2019 Document Reviewed: 08/06/2019 Elsevier Patient Education  2022 [employer].

## 2020-10-22 NOTE — Progress Notes (Signed)
Acute Office Visit  Subjective:    Patient ID: Yolanda Villa, female    DOB: May 12, 1995, 25 y.o.   MRN: [id number]  Chief Complaint  Patient presents with   Amenorrhea   Breast Pain    HPI Patient is in today for amenorrhea. Her last cycle was 09/19/20. She also reports breast tenderness and nausea. She has had unprotected sex. She had a negative urine pregnancy test at her visit with me 2 days ago.   Past Medical History:  Diagnosis Date   Anxiety    Dysmenorrhea    Heart murmur    STD (sexually transmitted disease)     Past Surgical History:  Procedure Laterality Date   WISDOM TOOTH EXTRACTION  10/2015   WISDOM TOOTH EXTRACTION  2017    Family History  Problem Relation Age of Onset   Hyperlipidemia Father    Depression Sister    Anxiety disorder Sister    Asthma Brother    Arthritis Maternal Grandmother    Breast cancer Maternal Grandmother    Heart disease Maternal Grandmother    Hypertension Maternal Grandmother    Arthritis Maternal Grandfather    Heart disease Maternal Grandfather    Hypertension Maternal Grandfather    Hyperlipidemia Maternal Grandfather    Cancer Maternal Grandfather    Kidney disease Paternal Grandmother    Stroke Paternal Grandfather    Diabetes Paternal Grandfather     Social History   Socioeconomic History   Marital status: Single    Spouse name: Not on file   Number of children: 0   Years of education: 14   Highest education level: Associate degree: academic program  Occupational History   Occupation: Glass blower/designer  Tobacco Use   Smoking status: Never   Smokeless tobacco: Never  Scientific laboratory technician Use: Never used  Substance and Sexual Activity   Alcohol use: No    Alcohol/week: 0.0 standard drinks   Drug use: No   Sexual activity: Yes    Partners: Male    Birth control/protection: None  Other Topics Concern   Not on file  Social History Narrative   Not on file   Social Determinants of Health   Financial  Resource Strain: Not on file  Food Insecurity: Not on file  Transportation Needs: Not on file  Physical Activity: Not on file  Stress: Not on file  Social Connections: Not on file  Intimate Partner Violence: Not on file    Outpatient Medications Prior to Visit  Medication Sig Dispense Refill   citalopram (CELEXA) 20 MG tablet Take 1 tablet (20 mg total) by mouth daily. (Patient not taking: Reported on 10/22/2020) 30 tablet 2   No facility-administered medications prior to visit.    No Known Allergies  Review of Systems Return to office for new or worsening symptoms, or if symptoms persist.     Objective:    Physical Exam Vitals and nursing note reviewed.  Constitutional:      General: She is not in acute distress.    Appearance: She is not ill-appearing, toxic-appearing or diaphoretic.  Cardiovascular:     Rate and Rhythm: Normal rate.  Pulmonary:     Effort: Pulmonary effort is normal. No respiratory distress.  Musculoskeletal:     Right lower leg: No edema.     Left lower leg: No edema.  Skin:    General: Skin is warm and dry.  Neurological:     General: No focal deficit present.  Mental Status: She is alert and oriented to person, place, and time.  Psychiatric:        Mood and Affect: Mood normal.        Behavior: Behavior normal.    There were no vitals taken for this visit. Wt Readings from Last 3 Encounters:  10/18/20 153 lb 8 oz (69.6 kg)  10/31/19 156 lb 6.4 oz (70.9 kg)  09/11/19 155 lb (70.3 kg)    There are no preventive care reminders to display for this patient.  There are no preventive care reminders to display for this patient.   Lab Results  Component Value Date   TSH 1.43 11/10/2019   Lab Results  Component Value Date   WBC 7.1 12/16/2018   HGB 13.1 12/16/2018   HCT 38.0 12/16/2018   MCV 90 12/16/2018   PLT 272 12/16/2018   Lab Results  Component Value Date   NA 143 12/16/2018   K 4.1 12/16/2018   CO2 25 12/16/2018    GLUCOSE 96 12/16/2018   BUN 7 12/16/2018   CREATININE 0.73 12/16/2018   BILITOT 0.6 12/16/2018   ALKPHOS 82 12/16/2018   AST 10 12/16/2018   ALT 14 12/16/2018   PROT 6.9 12/16/2018   ALBUMIN 4.4 12/16/2018   CALCIUM 8.4 (L) 12/16/2018   Lab Results  Component Value Date   CHOL 151 12/16/2018   Lab Results  Component Value Date   HDL 65 12/16/2018   Lab Results  Component Value Date   LDLCALC 65 12/16/2018   Lab Results  Component Value Date   TRIG 117 12/16/2018   Lab Results  Component Value Date   CHOLHDL 2.3 12/16/2018   Lab Results  Component Value Date   HGBA1C 4.8 12/21/2017       Assessment & Plan:   Hally was seen today for amenorrhea and breast pain.  Diagnoses and all orders for this visit:  Amenorrhea Labs pending as below.  -     hCG, serum, qualitative -     TSH -     CBC with Differential  Hyperprolactinemia (HCC) Labs pending.  -     Prolactin   Discussed follow up with endocrinology or her GYN if symptoms persist.   The patient indicates understanding of these issues and agrees with the plan.  Gwenlyn Perking, FNP

## 2020-10-23 LAB — CBC WITH DIFFERENTIAL/PLATELET
Basophils Absolute: 0 10*3/uL (ref 0.0–0.2)
Basos: 0 %
EOS (ABSOLUTE): 0.1 10*3/uL (ref 0.0–0.4)
Eos: 1 %
Hematocrit: 37 % (ref 34.0–46.6)
Hemoglobin: 12.6 g/dL (ref 11.1–15.9)
Immature Grans (Abs): 0 10*3/uL (ref 0.0–0.1)
Immature Granulocytes: 0 %
Lymphocytes Absolute: 2 10*3/uL (ref 0.7–3.1)
Lymphs: 20 %
MCH: 30.1 pg (ref 26.6–33.0)
MCHC: 34.1 g/dL (ref 31.5–35.7)
MCV: 89 fL (ref 79–97)
Monocytes Absolute: 0.6 10*3/uL (ref 0.1–0.9)
Monocytes: 6 %
Neutrophils Absolute: 7.2 10*3/uL — ABNORMAL HIGH (ref 1.4–7.0)
Neutrophils: 73 %
Platelets: 289 10*3/uL (ref 150–450)
RBC: 4.18 x10E6/uL (ref 3.77–5.28)
RDW: 12.3 % (ref 11.7–15.4)
WBC: 10 10*3/uL (ref 3.4–10.8)

## 2020-10-23 LAB — PROLACTIN: Prolactin: 25.6 ng/mL — ABNORMAL HIGH (ref 4.8–23.3)

## 2020-10-23 LAB — HCG, SERUM, QUALITATIVE: hCG,Beta Subunit,Qual,Serum: NEGATIVE m[IU]/mL (ref <6)

## 2020-10-23 LAB — TSH: TSH: 1.85 u[IU]/mL (ref 0.450–4.500)

## 2020-11-11 ENCOUNTER — Other Ambulatory Visit: Payer: Self-pay

## 2020-11-11 ENCOUNTER — Ambulatory Visit (INDEPENDENT_AMBULATORY_CARE_PROVIDER_SITE_OTHER): Payer: 59 | Admitting: Internal Medicine

## 2020-11-11 ENCOUNTER — Encounter: Payer: Self-pay | Admitting: Internal Medicine

## 2020-11-11 VITALS — BP 100/68 | HR 71 | Ht 62.0 in | Wt 151.0 lb

## 2020-11-11 DIAGNOSIS — E221 Hyperprolactinemia: Secondary | ICD-10-CM

## 2020-11-11 DIAGNOSIS — D352 Benign neoplasm of pituitary gland: Secondary | ICD-10-CM

## 2020-11-11 LAB — FOLLICLE STIMULATING HORMONE: FSH: 6 m[IU]/mL

## 2020-11-11 LAB — T3, FREE: T3, Free: 3.1 pg/mL (ref 2.3–4.2)

## 2020-11-11 LAB — CORTISOL: Cortisol, Plasma: 6.9 ug/dL

## 2020-11-11 LAB — TSH: TSH: 1.27 u[IU]/mL (ref 0.35–5.50)

## 2020-11-11 LAB — T4, FREE: Free T4: 0.76 ng/dL (ref 0.60–1.60)

## 2020-11-11 LAB — LUTEINIZING HORMONE: LH: 19.12 m[IU]/mL

## 2020-11-11 NOTE — Patient Instructions (Signed)
Please stop at the lab.  Please come back for a follow-up appointment in 1 year.  

## 2020-11-11 NOTE — Progress Notes (Addendum)
Patient ID: Yolanda Villa, female   DOB: 07-04-1995, 25 y.o.   MRN: [id number]   This visit occurred during the SARS-CoV-2 public health emergency.  Safety protocols were in place, including screening questions prior to the visit, additional usage of staff PPE, and extensive cleaning of exam room while observing appropriate contact time as indicated for disinfecting solutions.   HPI  Yolanda Villa is a 25 y.o.-year-old female, initially referred by her OB/GYN doctor, Dr. 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  Last visit 1 year ago.  Interim hx: Since last visit, she was again found to have a slightly high prolactin level on 10/22/2020.  Pregnancy test was negative at that time. Her 09/2020 and 10/2020 menses were late 2 weeks each.  They are otherwise unchanged in length (4 days) and amount of bleeding. She also had breast tenderness around the time of her menstrual cycle, no galactorrhea. No HAs, blurry vision. LMP: 10/25/2020. Started Celexa 10/22/2020.  Reviewed history: Pt. has been found to have a high prolactin level in 08/2019 -this was checked after she missed 1 menstrual cycle in 07/2019.  She describes that she had menarche at 25 y/o.  After this, she developed  heavy menses and dysmenorrhea >> started OCPs >> helped.  However, she stopped OCPs at 25 y/o >> did not have any menses for 1.5 years >> restarted OCPs at 25 y/o >> stopped again at 25 y/o >> regular menses afterwards, until her skipped menstrual cycle in 07/2019.  However, afterwards, she resumed monthly menses.   She is not on Risperdal, oral contraceptives, Reglan.  Further investigation with a pituitary MRI showed a possible pituitary microadenoma: Pituitary MRI (09/09/2019):  Dedicated imaging was performed through the sella turcica. The pituitary gland is overall normal in size, and there is a normal posterior pituitary bright spot. Pituitary enhancement is mildly heterogeneous,  however there is the suggestion of an underlying lesion in the midline of the gland both on pre and postcontrast thin coronal images. Precise measurement of the lesion's size is limited as its margins are not well-defined (potentially as large as 7 mm on the delayed thin coronal postcontrast sequence (series 22, image 5) though with the precontrast T1 sequence, whole brain coronal T1 postcontrast sequence, and lack of significant bulging of the superior pituitary contour more suggestive of a smaller underlying lesion (5 mm or less). Characterization on dynamic postcontrast sequences is limited by motion. There is no regional mass effect, and the infundibulum is midline. The cavernous sinuses and optic chiasm are unremarkable.  Patient prolactin levels were reviewed: Lab Results  Component Value Date   PROLACTIN 25.6 (H) 10/22/2020   PROLACTIN 18.7 09/13/2020   PROLACTIN 8.7 11/10/2019   PROLACTIN 57.2 (H) 08/12/2019   PROLACTIN 34.8 (H) 08/06/2019   PROLACTIN 12.2 07/17/2017   PROLACTIN 7.2 03/26/2014   PROLACTIN 5.8 03/29/2012   Patient's thyroid tests were reviewed and these were normal: Lab Results  Component Value Date   TSH 1.850 10/22/2020   TSH 1.43 11/10/2019   TSH 1.590 08/06/2019   TSH 2.360 07/17/2017   TSH 1.550 04/25/2017   TSH 1.454 03/26/2014   TSH 0.731 03/29/2012   FREET4 0.80 11/10/2019   The rest of her pituitary hormones were normal at last visit: Component     Latest Ref Rng & Units 11/10/2019  Estradiol, Free     pg/mL 0.73  Estradiol     pg/mL 28  IGF-I, LC/MS     83 -  456 ng/mL 374  Z-Score (Female)     -2.0 - 2 SD 1.4  TSH     0.35 - 4.50 uIU/mL 1.43  hCG Quant     mIU/mL <1  FSH     mIU/ML 6.2  Triiodothyronine,Free,Serum     2.3 - 4.2 pg/mL 3.6  T4,Free(Direct)     0.60 - 1.60 ng/dL 0.80  LH     mIU/mL 6.05  Cortisol, Plasma     ug/dL 8.6  C206 ACTH     6 - 50 pg/mL 9   She denies: -Galactorrhea -Headaches -Vision  problems -Weight gain -Increase in size of hands or shoe size -Acne -Hirsutism  No other medical hx other than a heart murmur and also BV.  Per mother's report: No family history of pituitary tumors.  Also, no family history of irregular menstrual cycles or infertility.  ROS: + See HPI  Past Medical History:  Diagnosis Date   Anxiety    Dysmenorrhea    Heart murmur    STD (sexually transmitted disease)    Past Surgical History:  Procedure Laterality Date   WISDOM TOOTH EXTRACTION  10/2015   WISDOM TOOTH EXTRACTION  2017   Social History   Socioeconomic History   Marital status: Single    Spouse name: Not on file   Number of children: 0   Years of education: 14   Highest education level: Associate degree: academic program  Occupational History   Occupation: Glass blower/designer  Tobacco Use   Smoking status: Never   Smokeless tobacco: Never  Vaping Use   Vaping Use: Never used  Substance and Sexual Activity   Alcohol use: No    Alcohol/week: 0.0 standard drinks   Drug use: No   Sexual activity: Yes    Partners: Male    Birth control/protection: None  Other Topics Concern   Not on file  Social History Narrative   Not on file   Social Determinants of Health   Financial Resource Strain: Not on file  Food Insecurity: Not on file  Transportation Needs: Not on file  Physical Activity: Not on file  Stress: Not on file  Social Connections: Not on file  Intimate Partner Violence: Not on file   Current Outpatient Medications on File Prior to Visit  Medication Sig Dispense Refill   citalopram (CELEXA) 20 MG tablet Take 1 tablet (20 mg total) by mouth daily. (Patient not taking: Reported on 10/22/2020) 30 tablet 2   No current facility-administered medications on file prior to visit.   No Known Allergies Family History  Problem Relation Age of Onset   Hyperlipidemia Father    Depression Sister    Anxiety disorder Sister    Asthma Brother    Arthritis Maternal  Grandmother    Breast cancer Maternal Grandmother    Heart disease Maternal Grandmother    Hypertension Maternal Grandmother    Arthritis Maternal Grandfather    Heart disease Maternal Grandfather    Hypertension Maternal Grandfather    Hyperlipidemia Maternal Grandfather    Cancer Maternal Grandfather    Kidney disease Paternal Grandmother    Stroke Paternal Grandfather    Diabetes Paternal Grandfather    PE: BP 100/68 (BP Location: Right Arm, Patient Position: Sitting, Cuff Size: Normal)   Pulse 71   Ht 5\' 2"  (1.575 m)   Wt 151 lb (68.5 kg)   SpO2 98%   BMI 27.62 kg/m  Wt Readings from Last 3 Encounters:  11/11/20 151 lb (68.5 kg)  10/22/20 155 lb (70.3 kg)  10/18/20 153 lb 8 oz (69.6 kg)   Constitutional: Slightly overweight, in NAD Eyes: PERRLA, EOMI, no exophthalmos ENT: moist mucous membranes, no thyromegaly, no cervical lymphadenopathy Cardiovascular: RRR, No MRG Respiratory: CTA B Gastrointestinal: abdomen soft, NT, ND, BS+ Musculoskeletal: no deformities, strength intact in all 4 Skin: moist, warm, no rashes Neurological: no tremor with outstretched hands, DTR normal in all 4  ASSESSMENT: 1.  Hyperprolactinemia Patient with history of hyperprolactinemia, resolved at last visit, but she had another prolactin slightly above the normal range on 10/22/2020: 25.6.  TSH was normal then.  Serum pregnancy test was negative. -We discussed at last visit about possible etiologies for this: Pituitary microadenoma secreting prolactin (she does have a possible 5 mm pituitary adenoma), but also: Pregnancy (serum Beta-hCG negative) Hypothyroidism (although she had normal TFTs) Stress  Exercise (she does not exercise excessively) Lack of sleep (she is working nights-starts at 12 AM and continues until either 8 AM or 12 PM) Medications (she is not taking psychotropic medications or Reglan). Drugs (denies) Chest wall lesions (denies) Seizures ( no history of ~) Liver ds (no  history of ~) Kidney disease (no history of ~) A teratoma containing pituitary cells that can develop into prolactinoma (very rarely) Macroprolactin (multimeric prolactin, especially in patients without galactorrhea -we did not check for this as her prolactin level normalized) idiopathic -High prolactin most frequently impacts menstrual cycles, however, her menses are regular now-they were delayed for the last 2 months (pregnancy test negative), but they still encourage monthly and they were unchanged in length and amount of bleeding. -We did discuss in the past about possibly using Cabergoline or bromocriptine and discussed about benefits of Cabergoline which is most frequently used due to being better tolerated, having beneficial effects on pituitary tumor size, and requiring less frequent dosing.  However, we did not have to start any so far. - we will recheck another prolactin level today  2. Pituitary microadenoma -She has a pituitary microadenoma incidentally found during investigation for high prolactin level -It is unclear whether this is the reason for her history of slightly high prolactin -We discussed that this is not a "brain tumor" -we checked the other pituitary hormone levels and these were normal -She does not have signs/symptoms of Cushing's disease, acromegaly, gonadotropin secreting tumor or TSH secreting tumor -At this visit we will recheck her pituitary hormones including a prolactin level -No repeat MRI is needed for now, but we may need to check this in the future  Component     Latest Ref Rng & Units 11/11/2020  IGF-I, LC/MS     83 - 456 ng/mL 327  Z-Score (Female)     -2.0 - 2.0 SD 1.2  Extra tube recieved        Specimen type recieved      Frozen Serum  TSH     0.35 - 5.50 uIU/mL 1.27  hCG Quant     mIU/mL <1  Prolactin     ng/mL 11.1  FSH     mIU/ML 6.0  Triiodothyronine,Free,Serum     2.3 - 4.2 pg/mL 3.1  T4,Free(Direct)     0.60 - 1.60 ng/dL 0.76   LH     mIU/mL 19.12  Cortisol, Plasma     ug/dL 6.9  C206 ACTH     6 - 50 pg/mL 31   All tests are normal including prolactin.    Component     Latest Ref Rng & Units 11/11/2020  Prolactin, Serum (ICMA)     ng/mL 22.1  Monomeric Prolactin (ICMA)     ng/mL 13.5  Percent Macroprolactin     % 39  Patient appears to have a large percentage of macroprolactin.  This usually is inactive and without clinical consequences.  Philemon Kingdom, MD PhD [facility]

## 2020-11-16 ENCOUNTER — Other Ambulatory Visit: Payer: Self-pay | Admitting: Family Medicine

## 2020-11-16 DIAGNOSIS — F320 Major depressive disorder, single episode, mild: Secondary | ICD-10-CM

## 2020-11-16 DIAGNOSIS — F411 Generalized anxiety disorder: Secondary | ICD-10-CM

## 2020-11-18 LAB — BETA HCG QUANT (REF LAB): hCG Quant: <1 m[IU]/mL

## 2020-11-18 LAB — MACROPROLACTIN
Monomeric Prolactin (ICMA): 13.5 ng/mL
Percent Macroprolactin: 39 %
Prolactin, Serum (ICMA): 22.1 ng/mL

## 2020-11-19 LAB — EXTRA SPECIMEN

## 2020-11-19 LAB — INSULIN-LIKE GROWTH FACTOR
IGF-I, LC/MS: 327 ng/mL (ref 83–456)
Z-Score (Female): 1.2 SD (ref ?–2.0)

## 2020-11-19 LAB — PROLACTIN: Prolactin: 11.1 ng/mL

## 2020-11-19 LAB — ESTRADIOL, FREE
Estradiol, Free: 2.68 pg/mL
Estradiol: 131 pg/mL

## 2020-11-19 LAB — ACTH: C206 ACTH: 31 pg/mL (ref 6–50)

## 2020-11-29 ENCOUNTER — Ambulatory Visit: Payer: 59 | Admitting: Family Medicine

## 2020-12-02 ENCOUNTER — Encounter: Payer: Self-pay | Admitting: Family Medicine

## 2020-12-29 ENCOUNTER — Encounter: Payer: Self-pay | Admitting: Nurse Practitioner

## 2020-12-29 ENCOUNTER — Ambulatory Visit: Payer: 59 | Admitting: Nurse Practitioner

## 2020-12-29 ENCOUNTER — Other Ambulatory Visit: Payer: Self-pay

## 2020-12-29 VITALS — BP 112/76 | HR 98

## 2020-12-29 DIAGNOSIS — Z789 Other specified health status: Secondary | ICD-10-CM

## 2020-12-29 NOTE — Progress Notes (Signed)
Acute Office Visit  Subjective:    Patient ID: Yolanda Villa, female    DOB: 03/17/1995, 25 y.o.   MRN: [id number]   HPI 25 y.o. presents today to discuss fertility. Has been actively trying to conceive for 6 months. She uses a cycle tracking app. Menses have been mostly regular but may be off by a few days each month. She does not feel she has ovulated in the past 2 months as she usually feels pain/discomfort, most often on the right. Normal TSH, prolactin (h/o pituitary microadenoma), LH, FSH, estradiol with PCP 11/11/2020 during mid cycle. LMP 12/24/2020. Partner has 3 children.    Review of Systems  Constitutional: Negative.   Genitourinary: Negative.       Objective:    Physical Exam Constitutional:      Appearance: Normal appearance.  GU: Not indicated  BP 112/76    Pulse 98    LMP 12/24/2020 (Exact Date)    SpO2 98%  Wt Readings from Last 3 Encounters:  11/11/20 151 lb (68.5 kg)  10/22/20 155 lb (70.3 kg)  10/18/20 153 lb 8 oz (69.6 kg)        Assessment & Plan:   Problem List Items Addressed This Visit   None Visit Diagnoses     Attempting to conceive    -  Primary   Relevant Orders   Progesterone   Anti mullerian hormone      Plan: Will return cycle day 21 for progesterone and we will check AMH at that time. Recent female hormone panel obtained at PCP office. If normal, it is recommended she use ovulation kit to track ovulation each month. We discussed ovulation signs and increasing intercourse during ovulation window. Healthy eating and lifestyle encouraged. Reassured that it takes an average of 12 months to conceive. Continue PNV. All questions answered.      Tamela Gammon DNP, 11:57 AM 12/29/2020

## 2021-01-13 ENCOUNTER — Encounter: Payer: Self-pay | Admitting: Obstetrics and Gynecology

## 2021-01-13 ENCOUNTER — Other Ambulatory Visit: Payer: Self-pay | Admitting: Nurse Practitioner

## 2021-01-13 ENCOUNTER — Other Ambulatory Visit: Payer: 59

## 2021-01-13 ENCOUNTER — Other Ambulatory Visit: Payer: Self-pay

## 2021-01-13 DIAGNOSIS — Z789 Other specified health status: Secondary | ICD-10-CM

## 2021-01-13 LAB — PROGESTERONE: Progesterone: 9.2 ng/mL

## 2021-01-17 ENCOUNTER — Encounter: Payer: Self-pay | Admitting: Nurse Practitioner

## 2021-01-17 LAB — ANTI-MULLERIAN HORMONE (AMH), FEMALE: Anti-Mullerian Hormones(AMH), Female: 4.25 ng/mL (ref 1.02–14.63)

## 2021-01-18 ENCOUNTER — Telehealth: Payer: Self-pay | Admitting: Nurse Practitioner

## 2021-01-18 NOTE — Telephone Encounter (Signed)
Attempted to call to discuss lab results as requested. No answer, Mychart message sent.

## 2021-01-20 ENCOUNTER — Ambulatory Visit: Payer: 59 | Admitting: Obstetrics and Gynecology

## 2021-01-20 ENCOUNTER — Other Ambulatory Visit: Payer: Self-pay

## 2021-01-20 ENCOUNTER — Encounter: Payer: Self-pay | Admitting: Obstetrics and Gynecology

## 2021-01-20 VITALS — BP 120/64 | HR 72 | Ht 62.0 in | Wt 154.0 lb

## 2021-01-20 DIAGNOSIS — B9689 Other specified bacterial agents as the cause of diseases classified elsewhere: Secondary | ICD-10-CM

## 2021-01-20 DIAGNOSIS — N760 Acute vaginitis: Secondary | ICD-10-CM

## 2021-01-20 DIAGNOSIS — N898 Other specified noninflammatory disorders of vagina: Secondary | ICD-10-CM

## 2021-01-20 DIAGNOSIS — N9089 Other specified noninflammatory disorders of vulva and perineum: Secondary | ICD-10-CM

## 2021-01-20 LAB — WET PREP FOR TRICH, YEAST, CLUE

## 2021-01-20 MED ORDER — METRONIDAZOLE 500 MG PO TABS: 500.0000 mg | ORAL_TABLET | Freq: Two times a day (BID) | ORAL | 0 refills | Status: DC

## 2021-01-20 NOTE — Progress Notes (Signed)
GYNECOLOGY  VISIT   HPI: 26 y.o.   Single White or Caucasian Not Hispanic or Latino  female   [id number] with Patient's last menstrual period was 12/24/2020 (exact date).   here for a rash on her vulva. The rash started this morning, it was feeling tingly, no pain, no itching. She worked a 12 hour shift last night and thinks her underwear may have irritated her. She but on Vaseline this am and is feeling a little better.  She has had a mild vaginal odor. No itching, burning, irritation or d/c.   GYNECOLOGIC HISTORY: Patient's last menstrual period was 12/24/2020 (exact date). 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     No current facility-administered medications for this visit.     ALLERGIES: Patient has no known allergies.  Family History  Problem Relation Age of Onset   Hyperlipidemia Father    Depression Sister    Anxiety disorder Sister    Asthma Brother    Arthritis Maternal Grandmother    Breast cancer Maternal Grandmother    Heart disease Maternal Grandmother    Hypertension  Maternal Grandmother    Arthritis Maternal Grandfather    Heart disease Maternal Grandfather    Hypertension Maternal Grandfather    Hyperlipidemia Maternal Grandfather    Cancer Maternal Grandfather    Kidney disease Paternal Grandmother    Stroke Paternal Grandfather    Diabetes Paternal Grandfather     Social History   Socioeconomic History   Marital status: Single    Spouse name: Not on file   Number of children: 0   Years of education: 14   Highest education level: Associate degree: academic program  Occupational History   Occupation: Glass blower/designer  Tobacco Use   Smoking status: Never   Smokeless tobacco: Never  Scientific laboratory technician Use: Never used  Substance and Sexual Activity   Alcohol use: No    Alcohol/week: 0.0 standard drinks   Drug use: No   Sexual activity: Yes    Partners: Male    Birth control/protection: None  Other Topics Concern   Not on file  Social History Narrative   Not on file   Social Determinants of Health   Financial Resource Strain: Not  on file  Food Insecurity: Not on file  Transportation Needs: Not on file  Physical Activity: Not on file  Stress: Not on file  Social Connections: Not on file  Intimate Partner Violence: Not on file    ROS  PHYSICAL EXAMINATION:    BP 120/64    Pulse 72    Ht 5\' 2"  (1.575 m)    Wt 154 lb (69.9 kg)    LMP 12/24/2020 (Exact Date)    SpO2 99%    BMI 28.17 kg/m     General appearance: alert, cooperative and appears stated age   Pelvic: External genitalia:  no lesions, mild erythema on the outer labia majora               Urethra:  normal appearing urethra with no masses, tenderness or lesions              Bartholins and Skenes: normal                 Vagina: normal appearing vagina with normal color and a slight watery/bloody vaginal d/c              Cervix: no lesions               Chaperone was present for exam.  1. Vaginal odor - WET PREP FOR TRICH, YEAST, CLUE: + clue  2. BV (bacterial  vaginosis) - metroNIDAZOLE (FLAGYL) 500 MG tablet; Take 1 tablet (500 mg total) by mouth 2 (two) times daily.  Dispense: 14 tablet; Refill: 0 -No ETOH with flagyl  3. Vulvar irritation Contact irritation Discussed vulvar skin care, use Vaseline as needed

## 2021-01-21 ENCOUNTER — Ambulatory Visit: Payer: 59 | Admitting: Family Medicine

## 2021-02-01 ENCOUNTER — Encounter: Payer: Self-pay | Admitting: Nurse Practitioner

## 2021-02-01 ENCOUNTER — Other Ambulatory Visit (HOSPITAL_COMMUNITY)
Admission: RE | Admit: 2021-02-01 | Discharge: 2021-02-01 | Disposition: A | Discharge status: Home / Self Care | Payer: 59 | Source: Ambulatory Visit | Attending: Nurse Practitioner | Admitting: Nurse Practitioner

## 2021-02-01 ENCOUNTER — Ambulatory Visit: Payer: 59 | Admitting: Nurse Practitioner

## 2021-02-01 VITALS — BP 108/67 | HR 80 | Temp 98.2°F | Ht 62.0 in | Wt 154.0 lb

## 2021-02-01 DIAGNOSIS — N898 Other specified noninflammatory disorders of vagina: Secondary | ICD-10-CM | POA: Diagnosis present

## 2021-02-01 DIAGNOSIS — Z113 Encounter for screening for infections with a predominantly sexual mode of transmission: Secondary | ICD-10-CM | POA: Diagnosis present

## 2021-02-01 LAB — WET PREP FOR TRICH, YEAST, CLUE
Clue Cell Exam: NEGATIVE
Trichomonas Exam: NEGATIVE

## 2021-02-01 NOTE — Patient Instructions (Signed)

## 2021-02-01 NOTE — Progress Notes (Signed)
Acute Office Visit  Subjective:    Patient ID: SUMI LYE, female    DOB: [birth year], 26 y.o.   MRN: [id number]  Chief Complaint  Patient presents with   Vaginitis    Vaginal itching    Vaginal Discharge The patient's primary symptoms include genital itching and vaginal discharge. The patient's pertinent negatives include no genital lesions or vaginal bleeding. This is a recurrent problem. The current episode started in the past 7 days. The problem occurs constantly. She is not pregnant. Pertinent negatives include no abdominal pain, back pain, chills, diarrhea, fever, flank pain, headaches, nausea, painful intercourse, rash or sore throat. The vaginal discharge was white. There has been no bleeding. The symptoms are aggravated by intercourse. She has tried antibiotics for the symptoms. The treatment provided no relief. It is unknown whether or not her partner has an STD. She uses oral contraceptives for contraception. Her menstrual history has been regular. Her past medical history is significant for an STD.    Past Medical History:  Diagnosis Date   Anxiety    Dysmenorrhea    Heart murmur    STD (sexually transmitted disease)     Past Surgical History:  Procedure Laterality Date   WISDOM TOOTH EXTRACTION  10/2015   WISDOM TOOTH EXTRACTION  2017    Family History  Problem Relation Age of Onset   Hyperlipidemia Father    Depression Sister    Anxiety disorder Sister    Asthma Brother    Arthritis Maternal Grandmother    Breast cancer Maternal Grandmother    Heart disease Maternal Grandmother    Hypertension Maternal Grandmother    Arthritis Maternal Grandfather    Heart disease Maternal Grandfather    Hypertension Maternal Grandfather    Hyperlipidemia Maternal Grandfather    Cancer Maternal Grandfather    Kidney disease Paternal Grandmother    Stroke Paternal Grandfather    Diabetes Paternal Grandfather     Social History   Socioeconomic History   Marital  status: Single    Spouse name: Not on file   Number of children: 0   Years of education: 14   Highest education level: Associate degree: academic program  Occupational History   Occupation: Glass blower/designer  Tobacco Use   Smoking status: Never   Smokeless tobacco: Never  Scientific laboratory technician Use: Never used  Substance and Sexual Activity   Alcohol use: No    Alcohol/week: 0.0 standard drinks   Drug use: No   Sexual activity: Yes    Partners: Male    Birth control/protection: None  Other Topics Concern   Not on file  Social History Narrative   Not on file   Social Determinants of Health   Financial Resource Strain: Not on file  Food Insecurity: Not on file  Transportation Needs: Not on file  Physical Activity: Not on file  Stress: Not on file  Social Connections: Not on file  Intimate Partner Violence: Not on file    Outpatient Medications Prior to Visit  Medication Sig Dispense Refill   metroNIDAZOLE (FLAGYL) 500 MG tablet Take 1 tablet (500 mg total) by mouth 2 (two) times daily. (Patient not taking: Reported on 02/01/2021) 14 tablet 0   Prenatal Vit-Fe Fumarate-FA (PRENATAL VITAMIN PO) Take by mouth. (Patient not taking: Reported on 02/01/2021)     No facility-administered medications prior to visit.    No Known Allergies  Review of Systems  Constitutional:  Negative for chills and fever.  HENT:  Negative for sore throat.   Gastrointestinal:  Negative for abdominal pain, diarrhea and nausea.  Genitourinary:  Positive for vaginal discharge. Negative for flank pain.  Musculoskeletal:  Negative for back pain.  Skin:  Negative for rash.  Neurological:  Negative for headaches.  All other systems reviewed and are negative.     Objective:    Physical Exam Vitals and nursing note reviewed.  Constitutional:      Appearance: Normal appearance. She is normal weight.  HENT:     Right Ear: External ear normal.     Left Ear: External ear normal.     Nose: Nose  normal.  Eyes:     Conjunctiva/sclera: Conjunctivae normal.  Cardiovascular:     Rate and Rhythm: Normal rate and regular rhythm.     Pulses: Normal pulses.     Heart sounds: Normal heart sounds.  Pulmonary:     Effort: Pulmonary effort is normal.     Breath sounds: Normal breath sounds.  Abdominal:     General: Bowel sounds are normal.  Skin:    General: Skin is warm.     Findings: No rash.  Neurological:     Mental Status: She is alert and oriented to person, place, and time.    BP 108/67    Pulse 80    Temp 98.2 F (36.8 C)    Ht 5\' 2"  (1.575 m)    Wt 154 lb (69.9 kg)    LMP 01/22/2021 (Approximate)    SpO2 99%    BMI 28.17 kg/m  Wt Readings from Last 3 Encounters:  02/01/21 154 lb (69.9 kg)  01/20/21 154 lb (69.9 kg)  11/11/20 151 lb (68.5 kg)    There are no preventive care reminders to display for this patient.  There are no preventive care reminders to display for this patient.   Lab Results  Component Value Date   TSH 1.27 11/11/2020   Lab Results  Component Value Date   WBC 10.0 10/22/2020   HGB 12.6 10/22/2020   HCT 37.0 10/22/2020   MCV 89 10/22/2020   PLT 289 10/22/2020   Lab Results  Component Value Date   NA 143 12/16/2018   K 4.1 12/16/2018   CO2 25 12/16/2018   GLUCOSE 96 12/16/2018   BUN 7 12/16/2018   CREATININE 0.73 12/16/2018   BILITOT 0.6 12/16/2018   ALKPHOS 82 12/16/2018   AST 10 12/16/2018   ALT 14 12/16/2018   PROT 6.9 12/16/2018   ALBUMIN 4.4 12/16/2018   CALCIUM 8.4 (L) 12/16/2018   Lab Results  Component Value Date   CHOL 151 12/16/2018   Lab Results  Component Value Date   HDL 65 12/16/2018   Lab Results  Component Value Date   LDLCALC 65 12/16/2018   Lab Results  Component Value Date   TRIG 117 12/16/2018   Lab Results  Component Value Date   CHOLHDL 2.3 12/16/2018   Lab Results  Component Value Date   HGBA1C 4.8 12/21/2017       Assessment & Plan:  Symptoms of vaginal discharge and discomfort not  well controlled.  Patient was recently treated for bacterial vaginosis with metronidazole.  Symptoms did not resolved.  Today patient is reporting worsening discharge.  Completed wet prep and urine cytology to rule out any form of STD.  Patient is currently sexually active without protection. Education provided to patient with printed handouts given.  Follow-up for worsening unresolved symptoms.  We will send medication pending lab results.  Problem List Items Addressed This Visit   None Visit Diagnoses     Vaginal itching    -  Primary   Relevant Orders   WET PREP FOR [place], YEAST, CLUE   GC/Chlamydia probe amp ([place])not at [facility]   HIV antibody (with reflex)   Screening for STD (sexually transmitted disease)       Relevant Orders   GC/Chlamydia probe amp ([place])not at [facility]   HIV antibody (with reflex)        No orders of the defined types were placed in this encounter.    Ivy Lynn, NP

## 2021-02-02 ENCOUNTER — Other Ambulatory Visit: Payer: Self-pay | Admitting: Nurse Practitioner

## 2021-02-02 DIAGNOSIS — B3731 Acute candidiasis of vulva and vagina: Secondary | ICD-10-CM

## 2021-02-02 MED ORDER — FLUCONAZOLE 150 MG PO TABS: 150.0000 mg | ORAL_TABLET | Freq: Once | ORAL | 0 refills | Status: AC

## 2021-02-03 LAB — GC/CHLAMYDIA PROBE AMP (~~LOC~~) NOT AT ARMC
Chlamydia: NEGATIVE
Comment: NEGATIVE
Comment: NORMAL
Neisseria Gonorrhea: NEGATIVE

## 2021-02-03 MED ORDER — FLUCONAZOLE 150 MG PO TABS: 150.0000 mg | ORAL_TABLET | Freq: Once | ORAL | 0 refills | Status: AC

## 2021-02-03 NOTE — Addendum Note (Signed)
Addended by: Ladean Raya on: 02/03/2021 10:19 AM   Modules accepted: Orders

## 2021-03-22 ENCOUNTER — Ambulatory Visit (INDEPENDENT_AMBULATORY_CARE_PROVIDER_SITE_OTHER): Payer: 59 | Admitting: Family Medicine

## 2021-03-22 DIAGNOSIS — N760 Acute vaginitis: Secondary | ICD-10-CM

## 2021-03-22 DIAGNOSIS — Z7251 High risk heterosexual behavior: Secondary | ICD-10-CM

## 2021-03-22 MED ORDER — METRONIDAZOLE 0.75 % VA GEL: 1.0000 | Freq: Every day | VAGINAL | 0 refills | Status: AC

## 2021-03-22 MED ORDER — MICONAZOLE 3 200 MG VA SUPP: 200.0000 mg | Freq: Every day | VAGINAL | 0 refills | Status: DC

## 2021-03-22 NOTE — Progress Notes (Signed)
Telephone visit ? ?Subjective: ?CC: UTI ?PCP: Gwenlyn Perking, FNP ?Yolanda Villa is a 26 y.o. female calls for telephone consult today. Patient provides verbal consent for consult held via phone. ? ?Due to COVID-19 pandemic this visit was conducted virtually. This visit type was conducted due to national recommendations for restrictions regarding the COVID-19 Pandemic (e.g. social distancing, sheltering in place) in an effort to limit this patient's exposure and mitigate transmission in our community. All issues noted in this document were discussed and addressed.  A physical exam was not performed with this format.  ? ?Location of patient: home ?Location of provider: [facility] ?Others present for call: none ? ?1. Vaginal irritation ?She reports cloudy urine, urinary odor (fishy) so she is worried about BV.  She noticed during intercourse she had some bleeding.  She worries about yeast.   She reports onset of symptoms 2 days ago.  Last intercourse Saturday, unprotected.  LMP: 02/19/2021.  Not on birth control.  She reports history of Chlamydia but has been successfully treated.  ? ? ? ? ?ROS: Per HPI ? ?No Known Allergies ?Past Medical History:  ?Diagnosis Date  ? Anxiety   ? Dysmenorrhea   ? Heart murmur   ? STD (sexually transmitted disease)   ? ?No current outpatient medications on file. ? ?Assessment/ Plan: ?25 y.o. female  ? ?Acute vaginitis - Plan: metroNIDAZOLE (METROGEL VAGINAL) 0.75 % vaginal gel, miconazole (MICONAZOLE 3) 200 MG vaginal suppository ? ?High risk heterosexual behavior ? ?We discussed that without swab it is an uncertain diagnosis but I will gladly empirically treat her for both yeast vaginitis and bacterial vaginosis.  She does have a history of chlamydia so we cannot rule out that she does not have GC or chlamydia.  We discussed the impact of untreated gonorrhea chlamydia including pelvic inflammatory disease and sterility.  She has not had a menstrual cycle and therefore we cannot  determine whether or not she is pregnant.  For this reason I have used topical treatments for this patient rather than oral treatments since there is an association with both Diflucan and Flagyl and teratogenesis.  I reviewed with her reasons for reevaluation including nonresolution of symptoms and or progression of symptoms.  She voiced good understanding will follow-up as needed.  Highly recommend contraception counseling and ongoing counseling about high risk sexual behaviors ? ?Start time: 3:45pm ?End time: 3:51pm ? ?Total time spent on patient care (including telephone call/ virtual visit): 6 minutes ? ?Janora Norlander, DO ?[facility] ?[phone] ? ? ?

## 2021-03-25 ENCOUNTER — Telehealth: Payer: Self-pay | Admitting: Family Medicine

## 2021-03-25 NOTE — Telephone Encounter (Signed)
I spoke with pt and she said her symptoms are better than they were when she saw Dr Darnell Level but not completely gone and wanted to know if this was normal. Advised pt it can take a few days and as long as her symptoms are improving. Advised pt if she feels she has worsening vaginal irritation/redness or if it persists to come back for further evaluation. ?

## 2021-03-29 ENCOUNTER — Encounter: Payer: Self-pay | Admitting: Family Medicine

## 2021-03-29 ENCOUNTER — Ambulatory Visit: Payer: 59 | Admitting: Family Medicine

## 2021-03-29 VITALS — BP 102/57 | HR 75 | Temp 98.2°F | Ht 62.0 in | Wt 154.2 lb

## 2021-03-29 DIAGNOSIS — N760 Acute vaginitis: Secondary | ICD-10-CM

## 2021-03-29 DIAGNOSIS — N949 Unspecified condition associated with female genital organs and menstrual cycle: Secondary | ICD-10-CM | POA: Diagnosis not present

## 2021-03-29 NOTE — Patient Instructions (Signed)

## 2021-03-29 NOTE — Progress Notes (Signed)
? ?Acute Office Visit ? ?Subjective:  ? ? Patient ID: Yolanda Villa, female    DOB: 03-25-1995, 25 y.o.   MRN: [id number] ? ?Chief Complaint  ?Patient presents with  ? Vaginitis  ? ? ?HPI ?Patient is in today for vaginitis. Had virtual visit on 03/22/21 due to fishy odor and vaginal irritation x 2 days. She was treated with metrogel and miconazole empirically. Testing was not done given virtual visit. She reports that the odor has resolved but she continues to have irritation. She has had areas of redness that is very sensitive. She feels like this didn't happen until she started the two creams. She stopped using the creams and all of the areas resolved other than one area. She reports that she was last sexually active on 03/19/21. She did not use protection. She reports LMP was 03/23/21/.  ? ? ?Past Medical History:  ?Diagnosis Date  ? Anxiety   ? Dysmenorrhea   ? Heart murmur   ? STD (sexually transmitted disease)   ? ? ?Past Surgical History:  ?Procedure Laterality Date  ? WISDOM TOOTH EXTRACTION  10/2015  ? WISDOM TOOTH EXTRACTION  2017  ? ? ?Family History  ?Problem Relation Age of Onset  ? Hyperlipidemia Father   ? Depression Sister   ? Anxiety disorder Sister   ? Asthma Brother   ? Arthritis Maternal Grandmother   ? Breast cancer Maternal Grandmother   ? Heart disease Maternal Grandmother   ? Hypertension Maternal Grandmother   ? Arthritis Maternal Grandfather   ? Heart disease Maternal Grandfather   ? Hypertension Maternal Grandfather   ? Hyperlipidemia Maternal Grandfather   ? Cancer Maternal Grandfather   ? Kidney disease Paternal Grandmother   ? Stroke Paternal Grandfather   ? Diabetes Paternal Grandfather   ? ? ?Social History  ? ?Socioeconomic History  ? Marital status: Single  ?  Spouse name: Not on file  ? Number of children: 0  ? Years of education: 78  ? Highest education level: Associate degree: academic program  ?Occupational History  ? Occupation: Glass blower/designer  ?Tobacco Use  ? Smoking status:  Never  ? Smokeless tobacco: Never  ?Vaping Use  ? Vaping Use: Never used  ?Substance and Sexual Activity  ? Alcohol use: No  ?  Alcohol/week: 0.0 standard drinks  ? Drug use: No  ? Sexual activity: Yes  ?  Partners: Male  ?  Birth control/protection: None  ?Other Topics Concern  ? Not on file  ?Social History Narrative  ? Not on file  ? ?Social Determinants of Health  ? ?Financial Resource Strain: Not on file  ?Food Insecurity: Not on file  ?Transportation Needs: Not on file  ?Physical Activity: Not on file  ?Stress: Not on file  ?Social Connections: Not on file  ?Intimate Partner Violence: Not on file  ? ? ?Outpatient Medications Prior to Visit  ?Medication Sig Dispense Refill  ? metroNIDAZOLE (METROGEL VAGINAL) 0.75 % vaginal gel Place 1 Applicatorful vaginally at bedtime for 7 days. 70 g 0  ? miconazole (MICONAZOLE 3) 200 MG vaginal suppository Place 1 suppository (200 mg total) vaginally at bedtime. 3 suppository 0  ? ?No facility-administered medications prior to visit.  ? ? ?No Known Allergies ? ?Review of Systems ?As per HPI.  ?   ?Objective:  ?  ?Physical Exam ?Vitals and nursing note reviewed. Exam conducted with a chaperone present.  ?Constitutional:   ?   General: She is not in acute distress. ?  Appearance: She is not ill-appearing, toxic-appearing or diaphoretic.  ?Pulmonary:  ?   Effort: Pulmonary effort is normal. No respiratory distress.  ?Genitourinary: ?   Exam position: Lithotomy position.  ?   Pubic Area: No rash or pubic lice.   ?   Labia:     ?   Right: No rash.     ?   Left: No rash.   ?   Vagina: No signs of injury and foreign body. Vaginal discharge (white, thick) present. No bleeding, lesions or prolapsed vaginal walls.  ?   Cervix: Erythema present. No cervical motion tenderness, discharge, lesion or cervical bleeding.  ?   Comments: Significant tenderness to perineum below vaginal opening with skin break down and erythema present.  ?Musculoskeletal:  ?   Right lower leg: No edema.  ?    Left lower leg: No edema.  ?Skin: ?   General: Skin is warm and dry.  ?Neurological:  ?   Mental Status: She is alert and oriented to person, place, and time.  ?Psychiatric:     ?   Mood and Affect: Mood normal.     ?   Behavior: Behavior normal.  ? ? ?BP (!) 102/57   Pulse 75   Temp 98.2 ?F (36.8 ?C) (Temporal)   Ht '5\' 2"'$  (1.575 m)   Wt 154 lb 4 oz (70 kg)   BMI 28.21 kg/m?  ?Wt Readings from Last 3 Encounters:  ?03/29/21 154 lb 4 oz (70 kg)  ?02/01/21 154 lb (69.9 kg)  ?01/20/21 154 lb (69.9 kg)  ? ? ?There are no preventive care reminders to display for this patient. ? ?There are no preventive care reminders to display for this patient. ? ? ?Lab Results  ?Component Value Date  ? TSH 1.27 11/11/2020  ? ?Lab Results  ?Component Value Date  ? WBC 10.0 10/22/2020  ? HGB 12.6 10/22/2020  ? HCT 37.0 10/22/2020  ? MCV 89 10/22/2020  ? PLT 289 10/22/2020  ? ?Lab Results  ?Component Value Date  ? NA 143 12/16/2018  ? K 4.1 12/16/2018  ? CO2 25 12/16/2018  ? GLUCOSE 96 12/16/2018  ? BUN 7 12/16/2018  ? CREATININE 0.73 12/16/2018  ? BILITOT 0.6 12/16/2018  ? ALKPHOS 82 12/16/2018  ? AST 10 12/16/2018  ? ALT 14 12/16/2018  ? PROT 6.9 12/16/2018  ? ALBUMIN 4.4 12/16/2018  ? CALCIUM 8.4 (L) 12/16/2018  ? ?Lab Results  ?Component Value Date  ? CHOL 151 12/16/2018  ? ?Lab Results  ?Component Value Date  ? HDL 65 12/16/2018  ? ?Lab Results  ?Component Value Date  ? [address] 65 12/16/2018  ? ?Lab Results  ?Component Value Date  ? TRIG 117 12/16/2018  ? ?Lab Results  ?Component Value Date  ? CHOLHDL 2.3 12/16/2018  ? ?Lab Results  ?Component Value Date  ? HGBA1C 4.8 12/21/2017  ? ? ?   ?Assessment & Plan:  ? ?Yolanda Villa was seen today for vaginitis. ? ?Diagnoses and all orders for this visit: ? ?Acute vaginitis ?-     NuSwab Vaginitis Plus (VG+) ? ?Genital lesion, female ?-     Herpes simplex virus culture ? ?Discontinue metrogel and miconazole cream for now. NuSWab pending. HSV culture pending as well. Discussed keeping area dry  and using barrier creams for now until results are available. Will notify patient of results and POC pending results. Patient declined labs for STD screening today. Safe sex practices discussed.  ? ?Return to office for new or  worsening symptoms, or if symptoms persist.  ? ?The patient indicates understanding of these issues and agrees with the plan. ? ?Gwenlyn Perking, FNP ? ?

## 2021-03-31 LAB — NUSWAB VAGINITIS PLUS (VG+)
Candida albicans, NAA: NEGATIVE
Candida glabrata, NAA: NEGATIVE
Chlamydia trachomatis, NAA: NEGATIVE
Neisseria gonorrhoeae, NAA: NEGATIVE
Trich vag by NAA: NEGATIVE

## 2021-03-31 LAB — HERPES SIMPLEX VIRUS CULTURE

## 2021-04-01 ENCOUNTER — Other Ambulatory Visit: Payer: Self-pay | Admitting: Family Medicine

## 2021-04-01 DIAGNOSIS — B009 Herpesviral infection, unspecified: Secondary | ICD-10-CM

## 2021-04-01 MED ORDER — VALACYCLOVIR HCL 1 G PO TABS: ORAL_TABLET | ORAL | 1 refills | Status: DC

## 2021-04-01 MED ORDER — VALACYCLOVIR HCL 500 MG PO TABS: 500.0000 mg | ORAL_TABLET | Freq: Every day | ORAL | 1 refills | Status: DC

## 2021-04-23 ENCOUNTER — Other Ambulatory Visit: Payer: Self-pay | Admitting: Family Medicine

## 2021-05-05 ENCOUNTER — Encounter: Payer: Self-pay | Admitting: Family Medicine

## 2021-05-05 ENCOUNTER — Ambulatory Visit (INDEPENDENT_AMBULATORY_CARE_PROVIDER_SITE_OTHER): Payer: 59 | Admitting: Family Medicine

## 2021-05-05 VITALS — BP 103/60 | HR 87 | Temp 98.3°F | Ht 62.0 in | Wt 161.0 lb

## 2021-05-05 DIAGNOSIS — G44019 Episodic cluster headache, not intractable: Secondary | ICD-10-CM

## 2021-05-05 DIAGNOSIS — N912 Amenorrhea, unspecified: Secondary | ICD-10-CM

## 2021-05-05 NOTE — Patient Instructions (Signed)

## 2021-05-05 NOTE — Progress Notes (Signed)
? ?Acute Office Visit ? ?Subjective:  ? ?  ?Patient ID: Yolanda Villa, female    DOB: 02/02/1995, 26 y.o.   MRN: [id number] ? ?Chief Complaint  ?Patient presents with  ? Headache  ?  Off and on for a week now   ? Amenorrhea  ?  Has not had cycle in 11 days   ? ? ?HPI ?Patient is in today for amenorrhea. She has been TTC. She reports an intermittent headache for the last week. She is typical of her prior to her cycle starting. This is a mild headache. She has had negative urine pregnancy tests at home. She has a history of elevated prolactin levels and irregular cycles. She denies nausea, vomiting, or breast tenderness.  ? ?She reports anxiety and depression has been worse for the last few weeks. She has working of lifestyle management and does not desire medication or counseling at this time.   ? ? ?  05/05/2021  ?  3:57 PM 02/01/2021  ?  3:56 PM 10/22/2020  ? 12:58 PM 10/18/2020  ?  9:05 AM 02/01/2016  ?  1:52 PM  ?Depression screen PHQ 2/9  ?Decreased Interest '2 1 1 1 '$ 0  ?Down, Depressed, Hopeless 2 1 0 1 0  ?PHQ - 2 Score '4 2 1 2 '$ 0  ?Altered sleeping 1 0 3 3   ?Tired, decreased energy 0 0 3 2   ?Change in appetite 0 0 2 1   ?Feeling bad or failure about yourself  2 0 0 1   ?Trouble concentrating 0 0 0 0   ?Moving slowly or fidgety/restless 0 0 0 0   ?Suicidal thoughts 2 0 0 0   ?PHQ-9 Score '9 2 9 9   '$ ?Difficult doing work/chores Somewhat difficult Somewhat difficult Somewhat difficult Very difficult   ? ? ?  05/05/2021  ?  3:57 PM 02/01/2021  ?  3:57 PM 10/22/2020  ? 12:58 PM 10/18/2020  ?  9:07 AM  ?GAD 7 : Generalized Anxiety Score  ?Nervous, Anxious, on Edge '1 2 1 2  '$ ?Control/stop worrying '3 2 2 3  '$ ?Worry too much - different things '3 2 3 3  '$ ?Trouble relaxing 2 1 0 3  ?Restless 0 '1 3 1  '$ ?Easily annoyed or irritable '2 1 2 3  '$ ?Afraid - awful might happen 0 3 0 1  ?Total GAD 7 Score '11 12 11 16  '$ ?Anxiety Difficulty Somewhat difficult Somewhat difficult Somewhat difficult Very difficult  ? ? ? ?ROS ? ? ?   ?Objective:  ?   ?BP 103/60   Pulse 87   Temp 98.3 ?F (36.8 ?C)   Ht '5\' 2"'$  (1.575 m)   Wt 161 lb (73 kg)   SpO2 98%   BMI 29.45 kg/m?  ? ? ?Physical Exam ?Vitals and nursing note reviewed.  ?Constitutional:   ?   General: She is not in acute distress. ?   Appearance: She is not ill-appearing, toxic-appearing or diaphoretic.  ?Cardiovascular:  ?   Rate and Rhythm: Normal rate and regular rhythm.  ?Pulmonary:  ?   Effort: Pulmonary effort is normal.  ?   Breath sounds: Normal breath sounds.  ?Abdominal:  ?   General: Bowel sounds are normal. There is no distension.  ?   Palpations: Abdomen is soft.  ?Skin: ?   General: Skin is warm and dry.  ?Neurological:  ?   Mental Status: She is alert and oriented to person, place, and time.  ?Psychiatric:     ?  Mood and Affect: Mood normal.     ?   Behavior: Behavior normal.  ? ? ?No results found for any visits on 05/05/21. ? ? ?   ?Assessment & Plan:  ? ?Jeralyn was seen today for headache and amenorrhea. ? ?Diagnoses and all orders for this visit: ? ?Amenorrhea ?Negative urine pregnancy. HCG level pending, will notify of results. Discussed to follow up with Ob/gyn for amenorrhea and irregular cycles ?-     Pregnancy, urine ?-     hCG, serum, qualitative ? ?Episodic cluster headache, not intractable ?Tylenol and/or advil prn.  ? ? ?Return if symptoms worsen or fail to improve. ? ?The patient indicates understanding of these issues and agrees with the plan. ? ?Gwenlyn Perking, FNP ? ? ?

## 2021-05-06 LAB — PREGNANCY, URINE: Preg Test, Ur: NEGATIVE

## 2021-05-06 LAB — HCG, SERUM, QUALITATIVE: hCG,Beta Subunit,Qual,Serum: NEGATIVE m[IU]/mL (ref <6)

## 2021-07-07 ENCOUNTER — Encounter: Payer: Self-pay | Admitting: Family Medicine

## 2021-07-07 ENCOUNTER — Ambulatory Visit: Payer: 59 | Admitting: Family Medicine

## 2021-07-07 VITALS — BP 107/65 | HR 86 | Temp 98.1°F | Ht 62.0 in | Wt 165.1 lb

## 2021-07-07 DIAGNOSIS — K5900 Constipation, unspecified: Secondary | ICD-10-CM

## 2021-07-07 DIAGNOSIS — K649 Unspecified hemorrhoids: Secondary | ICD-10-CM | POA: Diagnosis not present

## 2021-07-07 MED ORDER — PROCTOFOAM HC 1-1 % EX FOAM: 1.0000 | Freq: Two times a day (BID) | CUTANEOUS | 0 refills | Status: DC

## 2021-07-07 NOTE — Patient Instructions (Signed)

## 2021-07-07 NOTE — Progress Notes (Signed)
   Acute Office Visit  Subjective:     Patient ID: Yolanda Villa, female    DOB: [birth year], 26 y.o.   MRN: [id number]  Chief Complaint  Patient presents with   Rectal Pain    HPI Patient is in today for bright red rectal bleeding with BMs for 2 days. Reports painful BMs with straining. Last BM was yesterday. Denies abdominal pain, blood in stool, diarrhea, nausea, vomiting, fever, or weight loss. She denies dizziness or lightheadedness. Denies rectal pain or itching. She has not tried any remedies.   ROS As per HPI.      Objective:    BP 107/65   Pulse 86   Temp 98.1 F (36.7 C) (Temporal)   Ht '5\' 2"'$  (1.575 m)   Wt 165 lb 2 oz (74.9 kg)   SpO2 98%   BMI 30.20 kg/m    Physical Exam Vitals and nursing note reviewed.  Constitutional:      General: She is not in acute distress.    Appearance: She is not ill-appearing, toxic-appearing or diaphoretic.  Pulmonary:     Effort: Pulmonary effort is normal. No respiratory distress.  Abdominal:     General: Bowel sounds are normal. There is no distension.     Palpations: Abdomen is soft. There is no mass.     Tenderness: There is no abdominal tenderness. There is no guarding or rebound.     Hernia: No hernia is present.  Musculoskeletal:     Right lower leg: No edema.     Left lower leg: No edema.  Skin:    General: Skin is warm and dry.  Neurological:     General: No focal deficit present.     Mental Status: She is alert and oriented to person, place, and time.     Motor: No weakness.     Gait: Gait normal.  Psychiatric:        Mood and Affect: Mood normal.        Behavior: Behavior normal.     No results found for any visits on 07/07/21.      Assessment & Plan:   Yolanda Villa was seen today for rectal pain.  Diagnoses and all orders for this visit:  Hemorrhoids, unspecified hemorrhoid type Constipation, unspecified constipation type History consistent with hemorrhoids. Procotofoam as below. Discussed  prevention of hemorrhoids and treatment on constipation. Handout given.  -     hydrocortisone-pramoxine (PROCTOFOAM HC) rectal foam; Place 1 applicator rectally 2 (two) times daily.   Return if symptoms worsen or fail to improve.  The patient indicates understanding of these issues and agrees with the plan.   Gwenlyn Perking, FNP

## 2021-07-11 ENCOUNTER — Other Ambulatory Visit (HOSPITAL_COMMUNITY)
Admission: RE | Admit: 2021-07-11 | Discharge: 2021-07-11 | Disposition: A | Discharge status: Home / Self Care | Payer: 59 | Source: Ambulatory Visit | Attending: Family Medicine | Admitting: Family Medicine

## 2021-07-11 ENCOUNTER — Ambulatory Visit: Payer: 59 | Admitting: Family Medicine

## 2021-07-11 ENCOUNTER — Encounter: Payer: Self-pay | Admitting: Family Medicine

## 2021-07-11 VITALS — BP 109/67 | HR 79 | Temp 97.7°F | Ht 62.0 in | Wt 166.2 lb

## 2021-07-11 DIAGNOSIS — N760 Acute vaginitis: Secondary | ICD-10-CM

## 2021-07-11 DIAGNOSIS — N898 Other specified noninflammatory disorders of vagina: Secondary | ICD-10-CM | POA: Insufficient documentation

## 2021-07-11 DIAGNOSIS — B9689 Other specified bacterial agents as the cause of diseases classified elsewhere: Secondary | ICD-10-CM | POA: Diagnosis not present

## 2021-07-11 LAB — WET PREP FOR TRICH, YEAST, CLUE
Clue Cell Exam: POSITIVE — AB
Trichomonas Exam: NEGATIVE
Yeast Exam: NEGATIVE

## 2021-07-11 MED ORDER — METRONIDAZOLE 500 MG PO TABS: 500.0000 mg | ORAL_TABLET | Freq: Two times a day (BID) | ORAL | 0 refills | Status: DC

## 2021-07-11 NOTE — Progress Notes (Signed)
   Established Patient Office Visit  Subjective   Patient ID: Yolanda Villa, female    DOB: 1995-05-08  Age: 26 y.o. MRN: [id number]  Chief Complaint  Patient presents with   vaginal odor    X 5 days     HPI Yolanda Villa is here for vaginal odor x 5 days. She denies discharge, pain, itching, flank pain, fever, or dysuria. She has been sexually activity without protection. Recently went to the beach and changed soap while she was there. She has a history of recurrent BV. She is currently at the end of her cycle.   Past Medical History:  Diagnosis Date   Anxiety    Dysmenorrhea    Heart murmur    STD (sexually transmitted disease)       ROS As per HPI.    Objective:     BP 109/67   Pulse 79   Temp 97.7 F (36.5 C) (Temporal)   Ht '5\' 2"'$  (1.575 m)   Wt 166 lb 3.2 oz (75.4 kg)   LMP 07/10/2021   SpO2 100%   BMI 30.40 kg/m    Physical Exam Vitals and nursing note reviewed.  Constitutional:      General: She is not in acute distress.    Appearance: She is not ill-appearing, toxic-appearing or diaphoretic.  Pulmonary:     Effort: Pulmonary effort is normal. No respiratory distress.  Musculoskeletal:     Right lower leg: No edema.     Left lower leg: No edema.  Skin:    General: Skin is warm and dry.  Neurological:     General: No focal deficit present.     Mental Status: She is alert and oriented to person, place, and time.  Psychiatric:        Mood and Affect: Mood normal.        Behavior: Behavior normal.      No results found for any visits on 07/11/21.    The ASCVD Risk score (Arnett DK, et al., 2019) failed to calculate for the following reasons:   The 2019 ASCVD risk score is only valid for ages 86 to 42    Assessment & Plan:   Yolanda Villa was seen today for vaginal odor.  Diagnoses and all orders for this visit:  Vaginal odor Wet prep + clue cels, many bacteria. Will treat for BV with flagyl. Urine cytology pending for STD screening.  -     WET  PREP FOR Yolanda Villa, YEAST, CLUE -     Urine cytology ancillary only  Bacterial vaginosis -     metroNIDAZOLE (FLAGYL) 500 MG tablet; Take 1 tablet (500 mg total) by mouth 2 (two) times daily.  Return to office for new or worsening symptoms, or if symptoms persist.   The patient indicates understanding of these issues and agrees with the plan.  Yolanda Perking, FNP

## 2021-07-12 LAB — URINE CYTOLOGY ANCILLARY ONLY
Chlamydia: NEGATIVE
Comment: NEGATIVE
Comment: NORMAL
Neisseria Gonorrhea: NEGATIVE

## 2021-09-21 ENCOUNTER — Encounter: Payer: Self-pay | Admitting: Family Medicine

## 2021-09-21 ENCOUNTER — Ambulatory Visit: Payer: 59 | Admitting: Family Medicine

## 2021-09-21 VITALS — BP 116/70 | HR 84 | Temp 97.8°F | Ht 62.0 in | Wt 168.4 lb

## 2021-09-21 DIAGNOSIS — N898 Other specified noninflammatory disorders of vagina: Secondary | ICD-10-CM | POA: Diagnosis not present

## 2021-09-21 DIAGNOSIS — B372 Candidiasis of skin and nail: Secondary | ICD-10-CM

## 2021-09-21 DIAGNOSIS — B3741 Candidal cystitis and urethritis: Secondary | ICD-10-CM | POA: Diagnosis not present

## 2021-09-21 DIAGNOSIS — N926 Irregular menstruation, unspecified: Secondary | ICD-10-CM

## 2021-09-21 DIAGNOSIS — N3000 Acute cystitis without hematuria: Secondary | ICD-10-CM

## 2021-09-21 LAB — MICROSCOPIC EXAMINATION
RBC, Urine: NONE SEEN /hpf (ref 0–2)
Renal Epithel, UA: NONE SEEN /hpf

## 2021-09-21 LAB — URINALYSIS, COMPLETE
Bilirubin, UA: NEGATIVE
Glucose, UA: NEGATIVE
Ketones, UA: NEGATIVE
Nitrite, UA: NEGATIVE
Protein,UA: NEGATIVE
RBC, UA: NEGATIVE
Specific Gravity, UA: 1.01 (ref 1.005–1.030)
Urobilinogen, Ur: 0.2 mg/dL (ref 0.2–1.0)
pH, UA: 5.5 (ref 5.0–7.5)

## 2021-09-21 LAB — PREGNANCY, URINE: Preg Test, Ur: NEGATIVE

## 2021-09-21 MED ORDER — NITROFURANTOIN MONOHYD MACRO 100 MG PO CAPS: 100.0000 mg | ORAL_CAPSULE | Freq: Two times a day (BID) | ORAL | 0 refills | Status: DC

## 2021-09-21 MED ORDER — NYSTATIN 100000 UNIT/GM EX CREA: 1.0000 | TOPICAL_CREAM | Freq: Two times a day (BID) | CUTANEOUS | 0 refills | Status: DC

## 2021-09-21 MED ORDER — FLUCONAZOLE 150 MG PO TABS: ORAL_TABLET | ORAL | 0 refills | Status: DC

## 2021-09-21 MED ORDER — NITROFURANTOIN MONOHYD MACRO 100 MG PO CAPS: 100.0000 mg | ORAL_CAPSULE | Freq: Two times a day (BID) | ORAL | 0 refills | Status: AC

## 2021-09-21 NOTE — Patient Instructions (Signed)

## 2021-09-21 NOTE — Progress Notes (Signed)
[facility]  Acute Office Visit  Subjective:     Patient ID: Yolanda Villa, female    DOB: 1995-10-25, 26 y.o.   MRN: [id number]  Chief Complaint  Patient presents with   Vaginal Discharge    HPI Patient is in today for vaginal discharge. Reports that this started yesterday. Reports clear/white thin discharge with an odor. She has vaginal itching after wiping after urinating. She also reports frequent urination and vaginal burning prior to urination. She has a rash in her groin area. It is a painful rash, not itchy. She has has unprotected sex, although not in the last week. She has tried a boric acid suppository without improvement. She has not had a cycle in months and has a history of irregular cycles. Denies fever, chills, flank pain, or vomiting.   ROS As per HPI.      Objective:    BP 116/70   Pulse 84   Temp 97.8 F (36.6 C)   Ht _0  (1.575 m)   Wt 168 lb 6.4 oz (76.4 kg)   SpO2 100%   BMI 30.80 kg/m    Physical Exam Vitals and nursing note reviewed. Exam conducted with a chaperone present.  Constitutional:      General: She is not in acute distress.    Appearance: She is not ill-appearing, toxic-appearing or diaphoretic.  Pulmonary:     Effort: Pulmonary effort is normal. No respiratory distress.  Genitourinary:    Exam position: Lithotomy position.     Labia:        Right: No lesion.        Left: No lesion.      Vagina: No signs of injury and foreign body. Vaginal discharge (white) present. No erythema, tenderness, bleeding, lesions or prolapsed vaginal walls.     Cervix: No cervical motion tenderness, discharge, friability, lesion, erythema or cervical bleeding.     Uterus: Normal.      Comments: Yeast dermatitis to perineum.  Skin:    General: Skin is warm and dry.  Neurological:     Mental Status: She is alert and oriented to person, place, and time.  Psychiatric:        Mood and Affect: Mood normal.        Behavior: Behavior normal.    No results found  for any visits on 09/21/21.     Assessment & Plan:   Josslin was seen today for vaginal discharge.  Diagnoses and all orders for this visit:  Vaginal discharge NuSwab pending. Will notify patient of results.  -     NuSwab Vaginitis Plus (VG+)  Yeast cystitis Urine micro with yeast today. Diflucan as below.  -     Urinalysis, Complete -     fluconazole (DIFLUCAN) 150 MG tablet; Take one tablet by mouth now. May repeat in 3 days if symptoms persist  Acute cystitis without hematuria Urine micro with 11-30 WBCs and few bacteria. Macrobid as below pending culture.  -     nitrofurantoin, macrocrystal-monohydrate, (MACROBID) 100 MG capsule; Take 1 capsule (100 mg total) by mouth 2 (two) times daily for 5 days. -     Microscopic Examination -     Urine culture  Missed periods Negative urine pregnancy. Follow up with GYN.  -     Pregnancy, urine  Yeast dermatitis Nystatin as below.  -     nystatin cream (MYCOSTATIN); Apply 1 Application topically 2 (two) times daily.  Return if symptoms worsen or fail to improve.  The patient indicates understanding of these issues and agrees with the plan.  Gwenlyn Perking, FNP

## 2021-09-23 LAB — URINE CULTURE

## 2021-09-24 LAB — NUSWAB VAGINITIS PLUS (VG+)
Candida albicans, NAA: NEGATIVE
Candida glabrata, NAA: NEGATIVE
Chlamydia trachomatis, NAA: NEGATIVE
Megasphaera 1: HIGH Score — AB
Neisseria gonorrhoeae, NAA: NEGATIVE
Trich vag by NAA: POSITIVE — AB

## 2021-09-25 ENCOUNTER — Other Ambulatory Visit: Payer: Self-pay | Admitting: Family Medicine

## 2021-09-25 DIAGNOSIS — B009 Herpesviral infection, unspecified: Secondary | ICD-10-CM

## 2021-09-26 ENCOUNTER — Other Ambulatory Visit: Payer: Self-pay | Admitting: Family Medicine

## 2021-09-26 DIAGNOSIS — A599 Trichomoniasis, unspecified: Secondary | ICD-10-CM

## 2021-09-26 MED ORDER — METRONIDAZOLE 500 MG PO TABS: 500.0000 mg | ORAL_TABLET | Freq: Two times a day (BID) | ORAL | 0 refills | Status: DC

## 2021-09-29 ENCOUNTER — Telehealth: Payer: Self-pay | Admitting: Family Medicine

## 2021-09-29 DIAGNOSIS — A599 Trichomoniasis, unspecified: Secondary | ICD-10-CM

## 2021-09-29 MED ORDER — METRONIDAZOLE 250 MG PO TABS: 500.0000 mg | ORAL_TABLET | Freq: Two times a day (BID) | ORAL | 0 refills | Status: AC

## 2021-09-29 NOTE — Telephone Encounter (Signed)
I sent in fagyl 250 mg tablets instead for a smaller size. She will need to take 2 tablets twice a day. There is not really another treatment option for trich and she had been prescribed oral flagyl numerous times in the past. Avoid alcohol while taking flagyl and for 72 hours after and this can cause vomiting.

## 2021-09-29 NOTE — Telephone Encounter (Signed)
Called patient, no answer 

## 2021-10-17 ENCOUNTER — Ambulatory Visit (INDEPENDENT_AMBULATORY_CARE_PROVIDER_SITE_OTHER): Payer: 59 | Admitting: Family Medicine

## 2021-10-17 ENCOUNTER — Encounter: Payer: Self-pay | Admitting: Family Medicine

## 2021-10-17 VITALS — BP 106/69 | HR 76 | Temp 98.0°F | Ht 62.0 in | Wt 168.2 lb

## 2021-10-17 DIAGNOSIS — A599 Trichomoniasis, unspecified: Secondary | ICD-10-CM | POA: Diagnosis not present

## 2021-10-17 DIAGNOSIS — F331 Major depressive disorder, recurrent, moderate: Secondary | ICD-10-CM | POA: Insufficient documentation

## 2021-10-17 DIAGNOSIS — F411 Generalized anxiety disorder: Secondary | ICD-10-CM

## 2021-10-17 DIAGNOSIS — F339 Major depressive disorder, recurrent, unspecified: Secondary | ICD-10-CM | POA: Diagnosis not present

## 2021-10-17 MED ORDER — ESCITALOPRAM OXALATE 5 MG PO TABS: 5.0000 mg | ORAL_TABLET | Freq: Every day | ORAL | 3 refills | Status: DC

## 2021-10-17 NOTE — Patient Instructions (Signed)

## 2021-10-17 NOTE — Progress Notes (Signed)
Established Patient Office Visit  Subjective   Patient ID: Yolanda Villa, female    DOB: March 07, 1995  Age: 26 y.o. MRN: [id number]  Chief Complaint  Patient presents with   SEXUALLY TRANSMITTED DISEASE    HPI Yolanda Villa is here for a test of cure for trichomonas infection. She did complete a course of flagyl. She has not been sexually active since last visit. She denies symptoms.   She reprots an increase in anxiety. She has been trying to manage her symptoms but is interested in medications to assist. She tried celexa in the past, but this made her drowsy.      10/17/2021    9:01 AM 09/21/2021   11:55 AM 09/21/2021   11:50 AM 07/11/2021   11:49 AM 07/07/2021    9:03 AM  Depression screen PHQ 2/9  Decreased Interest 1 0 0 0 0  Down, Depressed, Hopeless 2 1 0 0 0  PHQ - 2 Score 3 1 0 0 0  Altered sleeping 0 1  0 0  Tired, decreased energy 0 1  0 0  Change in appetite 0 0  0 0  Feeling bad or failure about yourself  2 0  0 0  Trouble concentrating 0 0  0 0  Moving slowly or fidgety/restless 0 0  0 0  Suicidal thoughts 0 0  0 0  PHQ-9 Score 5 3  0 0  Difficult doing work/chores Somewhat difficult Somewhat difficult  Not difficult at all Not difficult at all      10/17/2021    9:01 AM 09/21/2021   11:55 AM 07/11/2021   11:49 AM 07/07/2021    9:04 AM  GAD 7 : Generalized Anxiety Score  Nervous, Anxious, on Edge '2 1 1 2  '$ Control/stop worrying '2 1 2 1  '$ Worry too much - different things '3 2 1 2  '$ Trouble relaxing 1 0 0 0  Restless 0 0 0 0  Easily annoyed or irritable 0 0 1 1  Afraid - awful might happen 2 0 0 1  Total GAD 7 Score '10 4 5 7  '$ Anxiety Difficulty Somewhat difficult Somewhat difficult Somewhat difficult Somewhat difficult      ROS As per HPI.    Objective:     BP 106/69   Pulse 76   Temp 98 F (36.7 C) (Temporal)   Ht '5\' 2"'$  (1.575 m)   Wt 168 lb 4 oz (76.3 kg)   SpO2 100%   BMI 30.77 kg/m    Physical Exam Vitals and nursing note reviewed.   Constitutional:      General: She is not in acute distress.    Appearance: She is not ill-appearing, toxic-appearing or diaphoretic.  HENT:     Head: Normocephalic and atraumatic.  Cardiovascular:     Rate and Rhythm: Normal rate and regular rhythm.     Heart sounds: Normal heart sounds. No murmur heard. Pulmonary:     Effort: Pulmonary effort is normal. No respiratory distress.     Breath sounds: Normal breath sounds.  Abdominal:     General: Bowel sounds are normal. There is no distension.     Palpations: Abdomen is soft.     Tenderness: There is no abdominal tenderness. There is no guarding or rebound.  Musculoskeletal:     Right lower leg: No edema.     Left lower leg: No edema.  Skin:    General: Skin is warm and dry.  Neurological:     General:  No focal deficit present.     Mental Status: She is alert and oriented to person, place, and time.  Psychiatric:        Mood and Affect: Mood normal.        Behavior: Behavior normal.    No results found for any visits on 10/17/21.    The ASCVD Risk score (Arnett DK, et al., 2019) failed to calculate for the following reasons:   The 2019 ASCVD risk score is only valid for ages 84 to 74    Assessment & Plan:   Yolanda Villa was seen today for sexually transmitted disease.  Diagnoses and all orders for this visit:  Trichimoniasis Nuswab pending for test of cure.  -     NuSwab Vaginitis Plus (VG+)  GAD (generalized anxiety disorder) Depression, recurrent ([place]) Not well controlled. Denies SI. Start lexapro as below.  -     escitalopram (LEXAPRO) 5 MG tablet; Take 1 tablet (5 mg total) by mouth daily.  Return in about 6 weeks (around 11/28/2021) for anxiety.   The patient indicates understanding of these issues and agrees with the plan.  Yolanda Perking, FNP

## 2021-10-19 LAB — NUSWAB VAGINITIS PLUS (VG+)
Candida albicans, NAA: NEGATIVE
Candida glabrata, NAA: NEGATIVE
Chlamydia trachomatis, NAA: NEGATIVE
Neisseria gonorrhoeae, NAA: NEGATIVE
Trich vag by NAA: NEGATIVE

## 2021-11-23 ENCOUNTER — Encounter: Payer: Self-pay | Admitting: Internal Medicine

## 2021-11-23 ENCOUNTER — Ambulatory Visit: Payer: 59 | Admitting: Internal Medicine

## 2021-11-23 VITALS — BP 110/64 | HR 74 | Ht 62.0 in | Wt 168.0 lb

## 2021-11-23 DIAGNOSIS — D352 Benign neoplasm of pituitary gland: Secondary | ICD-10-CM | POA: Diagnosis not present

## 2021-11-23 DIAGNOSIS — E221 Hyperprolactinemia: Secondary | ICD-10-CM | POA: Diagnosis not present

## 2021-11-23 LAB — CORTISOL: Cortisol, Plasma: 9.3 ug/dL

## 2021-11-23 LAB — TSH: TSH: 1.01 u[IU]/mL (ref 0.35–5.50)

## 2021-11-23 LAB — T3, FREE: T3, Free: 3.9 pg/mL (ref 2.3–4.2)

## 2021-11-23 LAB — T4, FREE: Free T4: 0.89 ng/dL (ref 0.60–1.60)

## 2021-11-23 NOTE — Progress Notes (Signed)
Patient ID: Yolanda Villa, female   DOB: December 13, 1995, 26 y.o.   MRN: [id number]   HPI  Yolanda Villa is a 26 y.o.-year-old female, initially referred by her OB/GYN doctor, Dr. 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  Last visit 1 year ago.  Interim hx: Since last visit, she saw OB/GYN: [facility] and 21-day progesterone levels were normal in 03/2021. She also had breast tenderness around the time of her menstrual cycle, but this resolved.  No galactorrhea. No HAs, blurry vision. She has a history of irregular menstrual cycles, then amenorrhea last spring. Now monthly menses - every month, but at different intervals. No immediate pregnancy plans.  Most recent pregnancy test was - 09/21/2021 (urine).  Reviewed history: Pt. has been found to have a high prolactin level in 08/2019 -this was checked after she missed 1 menstrual cycle in 07/2019.  She describes that she had menarche at 26 y/o.  After this, she developed  heavy menses and dysmenorrhea >> started OCPs >> helped.  However, she stopped OCPs at 26 y/o >> did not have any menses for 1.5 years >> restarted OCPs at 26 y/o >> stopped again at 26 y/o >> regular menses afterwards, until her skipped menstrual cycle in 07/2019.  However, afterwards, she resumed monthly menses.    She again had a slightly high prolactin level on 10/22/2020.  Pregnancy test was negative at that time.  She is not on Risperdal, oral contraceptives, Reglan.  Further investigation with a pituitary MRI showed a possible pituitary microadenoma: Pituitary MRI (09/09/2019):  Dedicated imaging was performed through the sella turcica. The pituitary gland is overall normal in size, and there is a normal posterior pituitary bright spot. Pituitary enhancement is mildly heterogeneous, however there is the suggestion of an underlying lesion in the midline of the gland both on pre and postcontrast thin coronal images. Precise measurement of the  lesion's size is limited as its margins are not well-defined (potentially as large as 7 mm on the delayed thin coronal postcontrast sequence (series 22, image 5) though with the precontrast T1 sequence, whole brain coronal T1 postcontrast sequence, and lack of significant bulging of the superior pituitary contour more suggestive of a smaller underlying lesion (5 mm or less). Characterization on dynamic postcontrast sequences is limited by motion. There is no regional mass effect, and the infundibulum is midline. The cavernous sinuses and optic chiasm are unremarkable.  Patient prolactin levels were reviewed: Component     Latest Ref Rng & Units 11/11/2020  Prolactin, Serum (ICMA)     ng/mL 22.1  Monomeric Prolactin (ICMA)     ng/mL 13.5  Percent Macroprolactin     % 39  Patient appears to have a large percentage of macroprolactin.  This usually is inactive and without clinical consequences.  Lab Results  Component Value Date   PROLACTIN 11.1 11/11/2020   PROLACTIN 25.6 (H) 10/22/2020   PROLACTIN 18.7 09/13/2020   PROLACTIN 8.7 11/10/2019   PROLACTIN 57.2 (H) 08/12/2019   PROLACTIN 34.8 (H) 08/06/2019   PROLACTIN 12.2 07/17/2017   PROLACTIN 7.2 03/26/2014   PROLACTIN 5.8 03/29/2012   Patient's thyroid tests were reviewed and these were normal: Lab Results  Component Value Date   TSH 1.27 11/11/2020   TSH 1.850 10/22/2020   TSH 1.43 11/10/2019   TSH 1.590 08/06/2019   TSH 2.360 07/17/2017   TSH 1.550 04/25/2017   TSH 1.454 03/26/2014   TSH 0.731 03/29/2012   FREET4 0.76 11/11/2020   FREET4  0.80 11/10/2019   The rest of her pituitary hormones were normal: Component     Latest Ref Rng & Units 11/11/2020  IGF-I, LC/MS     83 - 456 ng/mL 327  Z-Score (Female)     -2.0 - 2.0 SD 1.2  Extra tube recieved        Specimen type recieved      Frozen Serum  TSH     0.35 - 5.50 uIU/mL 1.27  hCG Quant     mIU/mL <1  Prolactin     ng/mL 11.1  FSH     mIU/ML 6.0   Triiodothyronine,Free,Serum     2.3 - 4.2 pg/mL 3.1  T4,Free(Direct)     0.60 - 1.60 ng/dL 0.76  LH     mIU/mL 19.12  Cortisol, Plasma     ug/dL 6.9  C206 ACTH     6 - 50 pg/mL 31   Component     Latest Ref Rng & Units 11/10/2019  Estradiol, Free     pg/mL 0.73  Estradiol     pg/mL 28  IGF-I, LC/MS     83 - 456 ng/mL 374  Z-Score (Female)     -2.0 - 2 SD 1.4  TSH     0.35 - 4.50 uIU/mL 1.43  hCG Quant     mIU/mL <1  FSH     mIU/ML 6.2  Triiodothyronine,Free,Serum     2.3 - 4.2 pg/mL 3.6  T4,Free(Direct)     0.60 - 1.60 ng/dL 0.80  LH     mIU/mL 6.05  Cortisol, Plasma     ug/dL 8.6  C206 ACTH     6 - 50 pg/mL 9   She denies: -Galactorrhea -Headaches -Vision problems -Weight gain -Increase in size of hands or shoe size -Acne -Hirsutism  No other medical hx other than a heart murmur and also BV.  Per mother's report: No family history of pituitary tumors.  Also, no family history of irregular menstrual cycles or infertility.  ROS: + See HPI  Past Medical History:  Diagnosis Date   Anxiety    Dysmenorrhea    Heart murmur    STD (sexually transmitted disease)    Past Surgical History:  Procedure Laterality Date   WISDOM TOOTH EXTRACTION  10/2015   WISDOM TOOTH EXTRACTION  2017   Social History   Socioeconomic History   Marital status: Single    Spouse name: Not on file   Number of children: 0   Years of education: 14   Highest education level: Associate degree: academic program  Occupational History   Occupation: Glass blower/designer  Tobacco Use   Smoking status: Never   Smokeless tobacco: Never  Vaping Use   Vaping Use: Never used  Substance and Sexual Activity   Alcohol use: No    Alcohol/week: 0.0 standard drinks of alcohol   Drug use: No   Sexual activity: Yes    Partners: Male    Birth control/protection: None  Other Topics Concern   Not on file  Social History Narrative   Not on file   Social Determinants of Health    Financial Resource Strain: Not on file  Food Insecurity: Not on file  Transportation Needs: Not on file  Physical Activity: Not on file  Stress: Not on file  Social Connections: Not on file  Intimate Partner Violence: Not on file   Current Outpatient Medications on File Prior to Visit  Medication Sig Dispense Refill   escitalopram (LEXAPRO) 5 MG  tablet Take 1 tablet (5 mg total) by mouth daily. 90 tablet 3   valACYclovir (VALTREX) 1000 MG tablet Take 1,000 mg by mouth daily.     valACYclovir (VALTREX) 500 MG tablet TAKE 1 TABLET (500 MG TOTAL) BY MOUTH DAILY. 90 tablet 1   No current facility-administered medications on file prior to visit.   No Known Allergies Family History  Problem Relation Age of Onset   Hyperlipidemia Father    Depression Sister    Anxiety disorder Sister    Asthma Brother    Arthritis Maternal Grandmother    Breast cancer Maternal Grandmother    Heart disease Maternal Grandmother    Hypertension Maternal Grandmother    Arthritis Maternal Grandfather    Heart disease Maternal Grandfather    Hypertension Maternal Grandfather    Hyperlipidemia Maternal Grandfather    Cancer Maternal Grandfather    Kidney disease Paternal Grandmother    Stroke Paternal Grandfather    Diabetes Paternal Grandfather    PE: BP 110/64 (BP Location: Right Arm, Patient Position: Sitting, Cuff Size: Normal)   Pulse 74   Ht '5\' 2"'$  (1.575 m)   Wt 168 lb (76.2 kg)   SpO2 99%   BMI 30.73 kg/m  Wt Readings from Last 3 Encounters:  11/23/21 168 lb (76.2 kg)  10/17/21 168 lb 4 oz (76.3 kg)  09/21/21 168 lb 6.4 oz (76.4 kg)   Constitutional: Slightly overweight, in NAD Eyes: no exophthalmos ENT: no neck masses, no cervical lymphadenopathy Cardiovascular: RRR, No MRG Respiratory: CTA B Musculoskeletal: no deformities Skin:no rashes Neurological: no tremor with outstretched hands  ASSESSMENT: 1.  Hyperprolactinemia -Patient with history of mild hyperprolactinemia, but  with normal prolactin level at last check, in 11/2020.  At that time, we checked a macro prolactin and this was elevated.  We discussed that this is caused by "sticky" prolactin molecules, which attached to each other, limiting prolactin clearance.  This is usually of no clinical consequence.  Her menstrual cycle irregularity does not appear to be related to the prolactin. -We will repeat her prolactin level for now (along with a pregnancy test, however, she is on her menstrual cycle right now), but if normal, no further investigation is needed for this  2. Pituitary microadenoma -She has a history of pituitary microadenoma incidentally found during investigation for high prolactin level -Since she does have a high percentage of macro prolactin, her pituitary microadenoma does not appear to be the reason for her slightly high prolactin -Latest pituitary MRI report was reviewed.  The tumor margin were not well-defined, and the size was very small: approximately 5 mm. -The rest of her pituitary hormones were normal -She does not have signs or symptoms of Cushing's disease, acromegaly, gonadotropin secreting tumor or TSH secreting tumor -I will repeat her pituitary hormones today but, if normal, no further investigation is needed -I will have her return to see me as needed  Component     Latest Ref Rng 11/23/2021  TSH     0.35 - 5.50 uIU/mL 1.01   hCG Quant     mIU/mL <1   Prolactin     ng/mL 11.3   Triiodothyronine,Free,Serum     2.3 - 4.2 pg/mL 3.9   T4,Free(Direct)     0.60 - 1.60 ng/dL 0.89   Cortisol, Plasma     ug/dL 9.3   C206 ACTH     6 - 50 pg/mL 35   IGF-I, LC/MS     83 - 456 ng/mL 303  Z-Score (Female)     -2.0 - 2.0 SD 1.1    All labs are normal.  No pregnancy.  Philemon Kingdom, MD PhD [facility] Endocr[facility]

## 2021-11-23 NOTE — Patient Instructions (Signed)
Please stop at the lab.  Please return to see me as needed.

## 2021-11-24 LAB — BETA HCG QUANT (REF LAB): hCG Quant: <1 m[IU]/mL

## 2021-11-30 ENCOUNTER — Encounter: Payer: Self-pay | Admitting: Family Medicine

## 2021-11-30 ENCOUNTER — Ambulatory Visit: Payer: 59 | Admitting: Family Medicine

## 2021-11-30 ENCOUNTER — Other Ambulatory Visit (HOSPITAL_COMMUNITY)
Admission: RE | Admit: 2021-11-30 | Discharge: 2021-11-30 | Disposition: A | Discharge status: Home / Self Care | Payer: 59 | Source: Ambulatory Visit | Attending: Family Medicine | Admitting: Family Medicine

## 2021-11-30 VITALS — BP 111/63 | HR 82 | Temp 98.1°F | Ht 62.0 in | Wt 167.2 lb

## 2021-11-30 DIAGNOSIS — N898 Other specified noninflammatory disorders of vagina: Secondary | ICD-10-CM | POA: Insufficient documentation

## 2021-11-30 DIAGNOSIS — B3731 Acute candidiasis of vulva and vagina: Secondary | ICD-10-CM | POA: Diagnosis not present

## 2021-11-30 DIAGNOSIS — F339 Major depressive disorder, recurrent, unspecified: Secondary | ICD-10-CM

## 2021-11-30 DIAGNOSIS — F411 Generalized anxiety disorder: Secondary | ICD-10-CM

## 2021-11-30 LAB — PROLACTIN: Prolactin: 11.3 ng/mL

## 2021-11-30 LAB — WET PREP FOR TRICH, YEAST, CLUE
Clue Cell Exam: NEGATIVE
Trichomonas Exam: NEGATIVE
Yeast Exam: POSITIVE — AB

## 2021-11-30 LAB — ACTH: C206 ACTH: 35 pg/mL (ref 6–50)

## 2021-11-30 LAB — INSULIN-LIKE GROWTH FACTOR
IGF-I, LC/MS: 303 ng/mL (ref 83–456)
Z-Score (Female): 1.1 SD (ref ?–2.0)

## 2021-11-30 MED ORDER — FLUCONAZOLE 150 MG PO TABS: ORAL_TABLET | ORAL | 0 refills | Status: DC

## 2021-11-30 NOTE — Progress Notes (Signed)
Acute Office Visit  Subjective:     Patient ID: Yolanda Villa, female    DOB: [birth year], 26 y.o.   MRN: [id number]  Chief Complaint  Patient presents with   Vaginal Discharge    Vaginal Discharge The patient's primary symptoms include vaginal discharge.   Patient is in today for vaginal discharge x 2 day. Reports white, clumpy discharge with some mild vaginal irritation and itching. LMP was around 11/23/21. She has had unprotected sex recently. She would like to be screened for STIs today although she decline testing for HIV and syphilis. She has a history of HSV, chlamydia, and trich.   She has been taking lexapro intermittenlty. She reports and anxiety and depression symptoms have been up and down. She would like to see a psychiatrist for evaluation.       11/30/2021    9:17 AM 10/17/2021    9:01 AM 09/21/2021   11:55 AM  Depression screen PHQ 2/9  Decreased Interest 2 1 0  Down, Depressed, Hopeless '1 2 1  '$ PHQ - 2 Score '3 3 1  '$ Altered sleeping 2 0 1  Tired, decreased energy 0 0 1  Change in appetite 2 0 0  Feeling bad or failure about yourself  2 2 0  Trouble concentrating 0 0 0  Moving slowly or fidgety/restless 0 0 0  Suicidal thoughts 0 0 0  PHQ-9 Score '9 5 3  '$ Difficult doing work/chores Somewhat difficult Somewhat difficult Somewhat difficult      11/30/2021    9:18 AM 10/17/2021    9:01 AM 09/21/2021   11:55 AM 07/11/2021   11:49 AM  GAD 7 : Generalized Anxiety Score  Nervous, Anxious, on Edge '3 2 1 1  '$ Control/stop worrying '1 2 1 2  '$ Worry too much - different things '3 3 2 1  '$ Trouble relaxing 0 1 0 0  Restless 0 0 0 0  Easily annoyed or irritable 3 0 0 1  Afraid - awful might happen 0 2 0 0  Total GAD 7 Score '10 10 4 5  '$ Anxiety Difficulty Somewhat difficult Somewhat difficult Somewhat difficult Somewhat difficult     Review of Systems  Genitourinary:  Positive for vaginal discharge.   As per HPI.      Objective:    BP 111/63   Pulse 82   Temp  98.1 F (36.7 C) (Temporal)   Ht '5\' 2"'$  (1.575 m)   Wt 167 lb 4 oz (75.9 kg)   SpO2 98%   BMI 30.59 kg/m    Physical Exam Vitals and nursing note reviewed.  Constitutional:      General: She is not in acute distress.    Appearance: She is not ill-appearing, toxic-appearing or diaphoretic.  Cardiovascular:     Rate and Rhythm: Normal rate and regular rhythm.     Heart sounds: Normal heart sounds. No murmur heard. Pulmonary:     Effort: Pulmonary effort is normal. No respiratory distress.     Breath sounds: Normal breath sounds.  Musculoskeletal:     Right lower leg: No edema.     Left lower leg: No edema.  Skin:    General: Skin is warm and dry.  Neurological:     General: No focal deficit present.     Mental Status: She is alert and oriented to person, place, and time.  Psychiatric:        Mood and Affect: Mood normal.        Behavior: Behavior normal.  Thought Content: Thought content normal.        Judgment: Judgment normal.     No results found for any visits on 11/30/21.      Assessment & Plan:   Libbi was seen today for vaginal discharge.  Diagnoses and all orders for this visit:  Vaginal discharge Wet prep today with yeast, many bacteria and WBC. Negative trich and clue. Will seen urine cytology for gonorrhea/chlamydia.  -     WET PREP FOR TRICH, YEAST, CLUE -     Urine cytology ancillary only  Yeast vaginitis + yeast on wet prep. Diflcan as below.  -     WET PREP FOR TRICH, YEAST, CLUE -     fluconazole (DIFLUCAN) 150 MG tablet; Take one tablet.  Repeat in 72 hours if symptoms are not completely resolved.  Depression, recurrent ([place]) GAD (generalized anxiety disorder) Not well controlled, denies SI. Discussed compliance with medication. Referral placed as requested.  -     Ambulatory referral to Psychiatry   Return in about 8 weeks (around 01/25/2022) for CPE.  The patient indicates understanding of these issues and agrees with the  plan.  Gwenlyn Perking, FNP

## 2021-11-30 NOTE — Patient Instructions (Signed)
[place] [facility]. [address] [place],  [place]  [address] Main: [phone]  Managing Depression, Adult Depression is a mental health condition that affects your thoughts, feelings, and actions. Being diagnosed with depression can bring you relief if you did not know why you have felt or behaved a certain way. It could also leave you feeling overwhelmed. Finding ways to manage your symptoms can help you feel more positive about your future. How to manage lifestyle changes Being depressed is difficult. Depression can increase the level of everyday stress. Stress can make depression symptoms worse. You may believe your symptoms cannot be managed or will never improve. However, there are many things you can try to help manage your symptoms. There is hope. Managing stress  Stress is your body's reaction to life changes and events, both good and bad. Stress can add to your feelings of depression. Learning to manage your stress can help lessen your feelings of depression. Try some of the following approaches to reducing your stress (stress reduction techniques): Listen to music that you enjoy and that inspires you. Try using a meditation app or take a meditation class. Develop a practice that helps you connect with your spiritual self. Walk in nature, pray, or go to a place of worship. Practice deep breathing. To do this, inhale slowly through your nose. Pause at the top of your inhale for a few seconds and then exhale slowly, letting yourself relax. Repeat this three or four times. Practice yoga to help relax and work your muscles. Choose a stress reduction technique that works for you. These techniques take time and practice to develop. Set aside 5-15 minutes a day to do them. Therapists can offer training in these techniques. Do these things to help manage stress: Keep a journal. Know your limits. Set healthy boundaries for yourself and others,  such as saying "no" when you think something is too much. Pay attention to how you react to certain situations. You may not be able to control everything, but you can change your reaction. Add humor to your life by watching funny movies or shows. Make time for activities that you enjoy and that relax you. Spend less time using electronics, especially at night before bed. The light from screens can make your brain think it is time to get up rather than go to bed.  Medicines Medicines, such as antidepressants, are often a part of treatment for depression. Talk with your pharmacist or health care provider about all the medicines, supplements, and herbal products that you take, their possible side effects, and what medicines and other products are safe to take together. Make sure to report any side effects you may have to your health care provider. Relationships Your health care provider may suggest family therapy, couples therapy, or individual therapy as part of your treatment. How to recognize changes Everyone responds differently to treatment for depression. As you recover from depression, you may start to: Have more interest in doing activities. Feel more hopeful. Have more energy. Eat a more regular amount of food. Have better mental focus. It is important to recognize if your depression is not getting better or is getting worse. The symptoms you had in the beginning may return, such as: Feeling tired. Eating too much or too little. Sleeping too much or too little. Feeling restless, agitated, or hopeless. Trouble focusing or making decisions. Having unexplained aches and pains. Feeling irritable, angry, or aggressive. If you or your family members  notice these symptoms coming back, let your health care provider know right away. Follow these instructions at home: Activity Try to get some form of exercise each day, such as walking. Try yoga, mindfulness, or other stress reduction  techniques. Participate in group activities if you are able. Lifestyle Get enough sleep. Cut down on or stop using caffeine, tobacco, alcohol, and any other harmful substances. Eat a healthy diet that includes plenty of vegetables, fruits, whole grains, low-fat dairy products, and lean protein. Limit foods that are high in solid fats, added sugar, or salt (sodium). General instructions Take over-the-counter and prescription medicines only as told by your health care provider. Keep all follow-up visits. It is important for your health care provider to check on your mood, behavior, and medicines. Your health care provider may need to make changes to your treatment. Where to find support Talking to others  Friends and family members can be sources of support and guidance. Talk to trusted friends or family members about your condition. Explain your symptoms and let them know that you are working with a health care provider to treat your depression. Tell friends and family how they can help. Finances Find mental health providers that fit with your financial situation. Talk with your health care provider if you are worried about access to food, housing, or medicine. Call your insurance company to learn about your co-pays and prescription plan. Where to find more information You can find support in your area from: Anxiety and Depression Association of America (ADAA): adaa.org Mental Health America: mentalhealthamerica.net [employer] on Mental Illness: nami.org Contact a health care provider if: You stop taking your antidepressant medicines, and you have any of these symptoms: Nausea. Headache. Light-headedness. Chills and body aches. Not being able to sleep (insomnia). You or your friends and family think your depression is getting worse. Get help right away if: You have thoughts of hurting yourself or others. Get help right away if you feel like you may hurt yourself or others, or  have thoughts about taking your own life. Go to your nearest emergency room or: Call 911. Call the [place] at [phone] or 988. This is open 24 hours a day. Text the Crisis Text Line at 810 732 2207. This information is not intended to replace advice given to you by your health care provider. Make sure you discuss any questions you have with your health care provider. Document Revised: 04/26/2021 Document Reviewed: 04/26/2021 Elsevier Patient Education  [place].

## 2021-12-02 LAB — URINE CYTOLOGY ANCILLARY ONLY
Chlamydia: NEGATIVE
Comment: NEGATIVE
Comment: NORMAL
Neisseria Gonorrhea: NEGATIVE

## 2021-12-28 ENCOUNTER — Ambulatory Visit (INDEPENDENT_AMBULATORY_CARE_PROVIDER_SITE_OTHER): Payer: 59 | Admitting: Psychiatry

## 2021-12-28 ENCOUNTER — Encounter (HOSPITAL_COMMUNITY): Payer: Self-pay | Admitting: Psychiatry

## 2021-12-28 DIAGNOSIS — F4310 Post-traumatic stress disorder, unspecified: Secondary | ICD-10-CM

## 2021-12-28 DIAGNOSIS — F331 Major depressive disorder, recurrent, moderate: Secondary | ICD-10-CM

## 2021-12-28 DIAGNOSIS — Z9152 Personal history of nonsuicidal self-harm: Secondary | ICD-10-CM

## 2021-12-28 DIAGNOSIS — F603 Borderline personality disorder: Secondary | ICD-10-CM | POA: Diagnosis not present

## 2021-12-28 DIAGNOSIS — G4726 Circadian rhythm sleep disorder, shift work type: Secondary | ICD-10-CM

## 2021-12-28 DIAGNOSIS — F411 Generalized anxiety disorder: Secondary | ICD-10-CM

## 2021-12-28 MED ORDER — FLUOXETINE HCL 10 MG PO CAPS: 10.0000 mg | ORAL_CAPSULE | Freq: Every day | ORAL | 1 refills | Status: DC

## 2021-12-28 NOTE — Patient Instructions (Addendum)
We discontinued her Lexapro today in favor of Prozac, otherwise known as fluoxetine.  Start taking this new medication at 10 mg once per day.  Take it in the morning whenever that would occur for you.  Please reach out to your insurer to find a DBT provider that is in network this is the most effective therapy for borderline personality diagnosis.  It should also help with her depression and anxiety.  I will provide a link here that you can use to start this process for DBT: [email or web address]
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
## 2021-12-30 ENCOUNTER — Telehealth: Payer: Self-pay | Admitting: Family Medicine

## 2021-12-30 DIAGNOSIS — E639 Nutritional deficiency, unspecified: Secondary | ICD-10-CM

## 2021-12-30 NOTE — Telephone Encounter (Signed)
Dr. Nehemiah Settle has requested that she have a vitamin D, b12, and iron panel checked. I have ordered future labs for these if she is able to stop by sometime to have them done.

## 2022-01-05 NOTE — Telephone Encounter (Signed)
Pt states she will come by either Friday or Monday

## 2022-01-27 ENCOUNTER — Other Ambulatory Visit (HOSPITAL_COMMUNITY): Payer: Self-pay | Admitting: Psychiatry

## 2022-01-27 DIAGNOSIS — F331 Major depressive disorder, recurrent, moderate: Secondary | ICD-10-CM

## 2022-01-27 DIAGNOSIS — F4310 Post-traumatic stress disorder, unspecified: Secondary | ICD-10-CM

## 2022-01-27 DIAGNOSIS — F411 Generalized anxiety disorder: Secondary | ICD-10-CM

## 2022-01-30 ENCOUNTER — Telehealth (INDEPENDENT_AMBULATORY_CARE_PROVIDER_SITE_OTHER): Payer: 59 | Admitting: Psychiatry

## 2022-01-30 ENCOUNTER — Encounter (HOSPITAL_COMMUNITY): Payer: Self-pay | Admitting: Psychiatry

## 2022-01-30 DIAGNOSIS — F411 Generalized anxiety disorder: Secondary | ICD-10-CM

## 2022-01-30 DIAGNOSIS — F603 Borderline personality disorder: Secondary | ICD-10-CM | POA: Diagnosis not present

## 2022-01-30 DIAGNOSIS — F4310 Post-traumatic stress disorder, unspecified: Secondary | ICD-10-CM | POA: Diagnosis not present

## 2022-01-30 DIAGNOSIS — F331 Major depressive disorder, recurrent, moderate: Secondary | ICD-10-CM | POA: Diagnosis not present

## 2022-01-30 DIAGNOSIS — G4726 Circadian rhythm sleep disorder, shift work type: Secondary | ICD-10-CM

## 2022-01-30 MED ORDER — FLUOXETINE HCL 10 MG PO CAPS: 10.0000 mg | ORAL_CAPSULE | Freq: Every day | ORAL | 1 refills | Status: DC

## 2022-01-30 NOTE — Patient Instructions (Signed)
We did not make any medication changes today.  Continue to take fluoxetine (Prozac) 10 mg once daily.  Do try to reach out to your insurer to find a DBT provider that is in network.  When you get a chance try to go by your PCP's office to get the vitamin levels we discussed.
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
## 2022-04-01 ENCOUNTER — Other Ambulatory Visit: Payer: Self-pay | Admitting: Family Medicine

## 2022-04-01 DIAGNOSIS — B009 Herpesviral infection, unspecified: Secondary | ICD-10-CM

## 2022-04-17 ENCOUNTER — Ambulatory Visit (HOSPITAL_COMMUNITY)
Admission: EM | Admit: 2022-04-17 | Discharge: 2022-04-17 | Disposition: A | Discharge status: Home / Self Care | Payer: 59 | Attending: Behavioral Health | Admitting: Behavioral Health

## 2022-04-17 ENCOUNTER — Inpatient Hospital Stay (HOSPITAL_COMMUNITY): Admission: AD | Admit: 2022-04-17 | Payer: 59 | Source: Intra-hospital

## 2022-04-17 ENCOUNTER — Encounter (HOSPITAL_COMMUNITY): Payer: Self-pay | Admitting: Behavioral Health

## 2022-04-17 DIAGNOSIS — R45851 Suicidal ideations: Secondary | ICD-10-CM | POA: Diagnosis not present

## 2022-04-17 HISTORY — DX: Suicidal ideations: R45.851

## 2022-04-17 MED ORDER — MAGNESIUM HYDROXIDE 400 MG/5ML PO SUSP: 30.0000 mL | Freq: Every day | ORAL | Status: DC | PRN

## 2022-04-17 MED ORDER — TRAZODONE HCL 50 MG PO TABS: 50.0000 mg | ORAL_TABLET | Freq: Every evening | ORAL | Status: DC | PRN

## 2022-04-17 MED ORDER — ACETAMINOPHEN 325 MG PO TABS: 650.0000 mg | ORAL_TABLET | Freq: Four times a day (QID) | ORAL | Status: DC | PRN

## 2022-04-17 MED ORDER — HYDROXYZINE HCL 25 MG PO TABS: 25.0000 mg | ORAL_TABLET | Freq: Three times a day (TID) | ORAL | Status: DC | PRN

## 2022-04-17 MED ORDER — ALUM & MAG HYDROXIDE-SIMETH 200-200-20 MG/5ML PO SUSP: 30.0000 mL | ORAL | Status: DC | PRN

## 2022-04-17 NOTE — ED Provider Notes (Signed)
Behavioral Health Urgent Care Medical Screening Exam  Patient Name: Yolanda Villa MRN: [id number] Date of Evaluation: 04/17/22 Chief Complaint:  "I had thoughts about going off a bridge today" Diagnosis:  Final diagnoses:  Suicidal ideation   History of Present Illness: Yolanda Villa is a 27 y.o. female patient with a past psychiatric history of MDD, GAD, borderline personality disorder, PTSD, cannabis use disorder, suicidal ideations, and non-suicidal self-harm who presented voluntarily and accompanied by her best friend Yolanda Villa to [facility] for a walk-in assessment with complaints of suicidal ideations earlier today with a plan to jump off a bridge or overdose on her medications. Patient requested for her friend to remain present during assessment.   Patient assessed face-to-face by this provider and chart reviewed on 04/17/22. On evaluation, Yolanda Villa is seated in assessment area in no acute distress. Patient is alert and oriented x4, cooperative and pleasant during assessment. Speech is clear and coherent, normal rate and volume. Eye contact is good. Mood is depressed with congruent affect. Thought process is coherent with logical thought content. Patient denies current suicidal and homicidal ideations and easily contracts verbally for safety with this Clinical research associate. Patient reports experiencing suicidal thoughts earlier today while driving in the car with her boyfriend with a plan to jump off a bridge or overdose on her medications "but I knew I wouldn't go to [facility] if I did that." Patient states she pulled over to the side of the road, began crying, and called her psychiatrist office who encouraged her to come to [facility] to be evaluated. Patient reports chronic suicidal thoughts since high school. Patient denies a history of suicide attempts. Patient reports a history of self-harm by cutting in high school to "ease the pain." Patient denies past psychiatric hospitalizations. Patient denies current  auditory and visual hallucinations but reports she heard her aunt's voice "telling me to come to [place]" while she was in high school. Patient states she heard her own intrusive thoughts today telling her to "just get up there and jump." Patient reports symptoms of paranoia while she is at work stating she feels like people watch her and are talking about her. Patient is able to converse coherently with goal-directed thoughts and no distractibility or preoccupation. Objectively, there is no evidence of psychosis/mania, delusional thinking, or indication that patient is responding to internal or external stimuli.  Patient endorses fair sleep and appetite. Patient lives alone in [place] but states her boyfriend does stay with her frequently. Patient denies access to weapons but states that her boyfriend does own a firearm. Patient is employed at [employer] on 3rd shift. Patient denies use of alcohol or illicit substances. Patient reports currently receiving therapy and medication management from Dr. Adrian Villa at [facility] at [place] and states that she has an appointment tomorrow at 4pm. Patient states that she is currently prescribed fluoxetine (unsure of dose) but reports she is noncompliant with this medication and only takes it "when I need it." Patient states, "I have not been consistent for the past 2 weeks taking it." Patient states this morning, she went through her boyfriend's phone and seen that he was texting other women and seeing them while she is at work. Patient states she had to drive her boyfriend to court this morning after seeing the texts, got into a verbal argument, and that is when she began experiencing the suicidal thoughts. Patient states this has happened several times before. Patient reports a recent PCOS diagnosis that she states  has also increased her depressive symptoms. Patient states that she has a strong support system that consists of her mom,  aunt, and sister.  Patient offered support and encouragement. Discussed admission to the continuous observation unit with recommendations for inpatient psychiatric treatment for medication management and stabilization. Patient is initially in agreement with plan of care and was accepted to [facility] 407-1 pending lab work and voluntary consent forms. While in assessment room waiting for nursing staff to obtain lab work, patient began requesting to leave stating she has to go to work tonight, is concerned about paying bills if she misses work, and has a dog at home to care for. Patient provided verbal consent for this provider to contact her mother Yolanda Villa [phone]) for collateral information and safety planning.  Call to patient's mother who shares that patient's boyfriend has contributed to patient's current mental health concerns. Mother states that she has encouraged patient to separate from her boyfriend "but she's an adult." Mother denies any current safety issues for patient returning home today and states that she can stay with patient at her home to keep her safe as long as patient agrees that her boyfriend will not be there. Spoke with patient who is in agreement that her boyfriend will not be at the home. Spoke with mother and patient to discuss the following safety plan:   Safety Plan Yolanda Villa [place] will reach out to her mother Yolanda Villa [phone]), call 911 or call mobile crisis, or go to nearest emergency room if condition worsens or if suicidal thoughts become active Patient will follow up with Dr. Adrian Villa at [facility] for outpatient psychiatric services (therapy/medication management). Patient advises she has an appointment 04/18/22 at 4pm.  The suicide prevention education provided includes the following: Suicide risk factors Suicide prevention and interventions National Suicide Hotline telephone number [facility] assessment telephone number [facility] Emergency Assistance 911 [facility] and/or Residential Mobile Crisis Unit telephone number Request made of family/significant other to: Yolanda Villa [phone] (mother) Remove weapons (e.g., guns, rifles, knives), all items previously/currently identified as safety concern.   Remove drugs/medications (over the counter, prescriptions, illicit drugs), all items previously/currently identified as a safety concern.   Discussed with patient that fluoxetine should be taken daily to be effective and not "as needed." Discussed with mother and patient following up with patient's outpatient psychiatric provider during her appointment tomorrow for continued therapy and medication management. Discussed methods to reduce the risk of self-injury or suicide attempts: Frequent conversations regarding unsafe thoughts. Remove all significant sharps. Remove all firearms. Remove all medications, including over-the-counter medications. Consider lockbox for medications and having a responsible person dispense medications until patient has strengthened coping skills. Room checks for sharps or other harmful objects. Secure all chemical substances that can be ingested or inhaled. Mother and patient are in agreement with plan of care.   Patient and her mother are educated and verbalize understanding of mental health resources and other crisis services in the community. They are instructed to call 911 and present to the nearest emergency room should patient experience any suicidal/homicidal ideation, auditory/visual/hallucinations, or detrimental worsening of mental health condition.    Flowsheet Row ED from 04/17/2022 in [facility] Office Visit from 12/28/2021 in [facility] at [place]  C-SSRS RISK CATEGORY High Risk Low Risk       Psychiatric Specialty Exam  Presentation  General Appearance:Appropriate for  Environment; Casual  Eye  Contact:Good  Speech:Clear and Coherent; Normal Rate  Speech Volume:Normal  Handedness:Right   Mood and Affect  Mood: Depressed  Affect: Congruent   Thought Process  Thought Processes: Coherent; Goal Directed  Descriptions of Associations:Intact  Orientation:Full (Time, Place and Person)  Thought Content:Logical    Hallucinations:None  Ideas of Reference:None  Suicidal Thoughts:No (Did have SI with a plan earlier today)  Homicidal Thoughts:No   Sensorium  Memory: Immediate Good; Recent Good; Remote Good  Judgment: Fair  Insight: Fair   Chartered certified accountant: Fair  Attention Span: Fair  Recall: [employer] of Knowledge: Fair  Language: Fair   Psychomotor Activity  Psychomotor Activity: Normal   Assets  Assets: Manufacturing systems engineer; Desire for Improvement; Financial Resources/Insurance; Housing; Physical Health; Resilience; Social Support; Transportation   Sleep  Sleep: Fair  Number of hours:  8   Physical Exam: Physical Exam Vitals and nursing note reviewed.  Constitutional:      General: She is not in acute distress.    Appearance: Normal appearance. She is not ill-appearing.  HENT:     Head: Normocephalic and atraumatic.     Nose: Nose normal.  Eyes:     General:        Right eye: No discharge.        Left eye: No discharge.     Conjunctiva/sclera: Conjunctivae normal.  Cardiovascular:     Rate and Rhythm: Normal rate.  Pulmonary:     Effort: Pulmonary effort is normal. No respiratory distress.  Musculoskeletal:        General: Normal range of motion.     Cervical back: Normal range of motion.  Skin:    General: Skin is warm and dry.  Neurological:     General: No focal deficit present.     Mental Status: She is alert and oriented to person, place, and time. Mental status is at baseline.  Psychiatric:        Attention and Perception: Attention and perception normal.         Mood and Affect: Affect normal. Mood is depressed.        Speech: Speech normal.        Behavior: Behavior normal. Behavior is cooperative.        Thought Content: Thought content normal.        Cognition and Memory: Cognition and memory normal.        Judgment: Judgment normal.    Review of Systems  Constitutional: Negative.   HENT: Negative.    Eyes: Negative.   Respiratory: Negative.    Cardiovascular: Negative.   Gastrointestinal: Negative.   Genitourinary: Negative.   Musculoskeletal: Negative.   Skin: Negative.   Neurological: Negative.   Endo/Heme/Allergies: Negative.   Psychiatric/Behavioral:  Positive for depression. Negative for hallucinations, memory loss and substance abuse. The patient is not nervous/anxious and does not have insomnia.    Blood pressure 132/83, pulse 88, temperature 98.2 F (36.8 C), temperature source Oral, resp. rate 18, SpO2 99 %. There is no height or weight on file to calculate BMI.  Musculoskeletal: Strength & Muscle Tone: within normal limits Gait & Station: normal Patient leans: N/A   [facility] MSE Discharge Disposition for Follow up and Recommendations: Based on my evaluation the patient does not appear to have an emergency medical condition and can be discharged with resources and follow up care in outpatient services for Medication Management and Individual Therapy   Sunday Corn, NP 04/17/2022, 3:41 PM

## 2022-04-17 NOTE — Progress Notes (Signed)
Pt was accepted to [facility] TODAY 04/17/2022, pending labs, covid, and signed voluntary consent faxed to [phone]. Bed assignment: 407-1  Pt meets inpatient criteria per Erskine Emery, NP  Attending Physician will be Phineas Inches, MD  Report can be called to: - Adult unit: [phone]  Pt can arrive after pending items are received  Care Team Notified: [facility] Rona Ravens, RN, Erskine Emery, NP, and Harless Litten, RN  [place], [place]  04/17/2022 1:50 PM

## 2022-04-17 NOTE — Discharge Instructions (Addendum)
Safety Plan Yolanda Villa will reach out to her mother Yolanda Villa [phone]), call 911 or call mobile crisis, or go to nearest emergency room if condition worsens or if suicidal thoughts become active Patient will follow up with Dr. Adrian Blackwater at [facility] for outpatient psychiatric services (therapy/medication management). Patient advises she has an appointment 04/18/22 at 4pm.  The suicide prevention education provided includes the following: Suicide risk factors Suicide prevention and interventions National Suicide Hotline telephone number [place] assessment telephone number [facility] Emergency Assistance 911 [facility] and/or Residential Mobile Crisis Unit telephone number Request made of family/significant other to: Yolanda Villa [phone] (mother) Remove weapons (e.g., guns, rifles, knives), all items previously/currently identified as safety concern.   Remove drugs/medications (over the counter, prescriptions, illicit drugs), all items previously/currently identified as a safety concern.     [facility]: Outpatient psychiatric Services  New Patient Assessment and Therapy Walk-in Monday thru Thursday 8:00 am first come first serve until slots are full Every Friday from 1:00 pm to 4:00 pm first come first serve until slots are full  New Patient Psychiatric Medication Management Monday thru Friday from 8:00 am to 11:00 am first come first served until slots are full  For all walk-ins we ask that you arrive by 7:15 am because patients will be seen in there order of arrival.   Availability is limited, and therefore you may not be seen on the same day that you walk in.  Our goal is to serve and meet the needs of our community to the best of our ability.    Discharge recommendations:   Medications: Patient is to take medications as prescribed. The patient or patient's guardian is to  contact a medical professional and/or outpatient provider to address any new side effects that develop. The patient or the patient's guardian should update outpatient providers of any new medications and/or medication changes.   Outpatient Follow up: Please follow up with your outpatient psychiatry Dr. Adrian Blackwater at [facility] [place]. Please follow up with your primary care provider for all medical related needs.   Therapy: We recommend that patient participate in individual therapy to address mental health concerns.  Safety:   The following safety precautions should be taken:   No sharp objects. This includes scissors, razors, scrapers, and putty knives.   Chemicals should be removed and locked up.   Medications should be removed and locked up.   Weapons should be removed and locked up. This includes firearms, knives and instruments that can be used to cause injury.   The patient should abstain from use of illicit substances/drugs and abuse of any medications.  If symptoms worsen or do not continue to improve or if the patient becomes actively suicidal or homicidal then it is recommended that the patient return to the closest hospital emergency department, the [facility], or call 911 for further evaluation and treatment. National Suicide Prevention Lifeline 1-800-SUICIDE or 516-746-2499.  About 988 988 offers 24/7 access to trained crisis counselors who can help people experiencing mental health-related distress. People can call or text 988 or chat 988lifeline.org for themselves or if they are worried about a loved one who may need crisis support.

## 2022-04-17 NOTE — Progress Notes (Signed)
   04/17/22 1141  [facility] Triage Screening (Walk-ins at [facility] only)  How Did You Hear About [place]? Other (Comment)  What Is the Reason for Your Visit/Call Today? Yolanda Villa is a 27 year old female presenting to [facility] with chief complaint of SI with plan to jump off a bridge or OD on medications. Today patient found out that her partner was cheating on her and after confronting him patient developed SI and reports hearing voices of "the devil or something telling me to jump off a bridge". Pt reports she sat on the side of the road crying and decided to call her psychiatrist office to see Dr. Adrian Blackwater but he was not there. She was instructed to come here for evaluation and asked her friend to support her. Patient has no prior suicide attempts and denies psychiatric hospitalization. Patient reports diagnosis of personality disorder, depression and PTSD. Patient does not receive outpatient therapy. Patient reports a history of SIB by cutting her arm when she was in highschool.  Patient denies alcohol and drug use denies HI and VH.  How Long Has This Been Causing You Problems? 1 wk - 1 month  Have You Recently Had Any Thoughts About Hurting Yourself? Yes  How long ago did you have thoughts about hurting yourself? today  Are You Planning to Commit Suicide/Harm Yourself At This time? Yes  Have you Recently Had Thoughts About Hurting Someone Karolee Ohs? No  Are you currently experiencing any auditory, visual or other hallucinations? No  Have You Used Any Alcohol or Drugs in the Past 24 Hours? No  Do you have any current medical co-morbidities that require immediate attention? No  Clinician description of patient physical appearance/behavior: tearful  What Do You Feel Would Help You the Most Today? Treatment for Depression or other mood problem  If access to [facility] was not available, would you have sought care in the Emergency Department? No  Determination of Need Urgent (48 hours)  Options For Referral Medication  Management;Outpatient Therapy;Inpatient Hospitalization

## 2022-04-18 ENCOUNTER — Telehealth (INDEPENDENT_AMBULATORY_CARE_PROVIDER_SITE_OTHER): Payer: 59 | Admitting: Psychiatry

## 2022-04-18 ENCOUNTER — Encounter (HOSPITAL_COMMUNITY): Payer: Self-pay | Admitting: Psychiatry

## 2022-04-18 DIAGNOSIS — F411 Generalized anxiety disorder: Secondary | ICD-10-CM

## 2022-04-18 DIAGNOSIS — F331 Major depressive disorder, recurrent, moderate: Secondary | ICD-10-CM

## 2022-04-18 DIAGNOSIS — F4310 Post-traumatic stress disorder, unspecified: Secondary | ICD-10-CM

## 2022-04-18 DIAGNOSIS — R45851 Suicidal ideations: Secondary | ICD-10-CM | POA: Diagnosis not present

## 2022-04-18 DIAGNOSIS — F603 Borderline personality disorder: Secondary | ICD-10-CM | POA: Diagnosis not present

## 2022-04-18 DIAGNOSIS — G4726 Circadian rhythm sleep disorder, shift work type: Secondary | ICD-10-CM

## 2022-04-18 DIAGNOSIS — E282 Polycystic ovarian syndrome: Secondary | ICD-10-CM | POA: Insufficient documentation

## 2022-04-18 MED ORDER — FLUOXETINE HCL 10 MG PO CAPS
10.0000 mg | ORAL_CAPSULE | Freq: Every day | ORAL | 1 refills | Status: DC
Start: 2022-04-18 — End: 2022-05-18

## 2022-04-18 NOTE — Patient Instructions (Signed)
We restarted the Prozac at 10 mg once daily.  This was effective before for your mood and I imagine the same dose will be effective again.  Find ways to be kind to yourself here in the break up period.  Reach out to your insurer to find a DBT (dialectical behavioral therapy) provider that is in network.  Try to start the supplement that your PCP prescribed for vitamin D deficiency and if you have not had your vitamin B12, folate, iron panel checked yet that would be good.  If the suicidal thoughts come back or worsen please let us know or go to the emergency department again.
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
## 2022-04-20 ENCOUNTER — Encounter (HOSPITAL_COMMUNITY): Payer: Self-pay

## 2022-05-11 ENCOUNTER — Other Ambulatory Visit (HOSPITAL_COMMUNITY): Payer: Self-pay | Admitting: Psychiatry

## 2022-05-11 DIAGNOSIS — F331 Major depressive disorder, recurrent, moderate: Secondary | ICD-10-CM

## 2022-05-11 DIAGNOSIS — F411 Generalized anxiety disorder: Secondary | ICD-10-CM

## 2022-05-11 DIAGNOSIS — F4310 Post-traumatic stress disorder, unspecified: Secondary | ICD-10-CM

## 2022-05-12 ENCOUNTER — Ambulatory Visit: Payer: 59 | Admitting: Family Medicine

## 2022-05-12 ENCOUNTER — Encounter: Payer: Self-pay | Admitting: Family Medicine

## 2022-05-12 VITALS — BP 111/66 | HR 91 | Temp 98.1°F | Ht 62.0 in | Wt 175.2 lb

## 2022-05-12 DIAGNOSIS — F339 Major depressive disorder, recurrent, unspecified: Secondary | ICD-10-CM

## 2022-05-12 DIAGNOSIS — N760 Acute vaginitis: Secondary | ICD-10-CM | POA: Diagnosis not present

## 2022-05-12 DIAGNOSIS — Z8659 Personal history of other mental and behavioral disorders: Secondary | ICD-10-CM | POA: Diagnosis not present

## 2022-05-12 DIAGNOSIS — Z113 Encounter for screening for infections with a predominantly sexual mode of transmission: Secondary | ICD-10-CM

## 2022-05-12 DIAGNOSIS — B9689 Other specified bacterial agents as the cause of diseases classified elsewhere: Secondary | ICD-10-CM

## 2022-05-12 LAB — WET PREP FOR TRICH, YEAST, CLUE
Clue Cell Exam: POSITIVE — AB
Trichomonas Exam: NEGATIVE
Yeast Exam: NEGATIVE

## 2022-05-12 MED ORDER — METRONIDAZOLE 250 MG PO TABS
500.0000 mg | ORAL_TABLET | Freq: Two times a day (BID) | ORAL | 0 refills | Status: AC
Start: 2022-05-12 — End: 2022-05-19

## 2022-05-12 NOTE — Progress Notes (Signed)
Established Patient Office Visit  Subjective   Patient ID: Yolanda Villa, female    DOB: 11/02/1995  Age: 27 y.o. MRN: [id number]  Chief Complaint  Patient presents with   std screening    HPI Yolanda Villa reprots vaginal odor starting yesterday. She has also noticed increase clear discharge. She would like to be screening for STIs as a recent partner also had other sexual partners. She denies know exposure.    She has been following up with Dr. Adrian Blackwater for anxiety/depression. Denies SI currently, plan, or intent. Reports that she is doing alright now.       05/12/2022    1:05 PM 12/28/2021   10:47 AM 11/30/2021    9:17 AM  Depression screen PHQ 2/9  Decreased Interest 1  2  Down, Depressed, Hopeless 1  1  PHQ - 2 Score 2  3  Altered sleeping 0  2  Tired, decreased energy 0  0  Change in appetite 0  2  Feeling bad or failure about yourself  1  2  Trouble concentrating 0  0  Moving slowly or fidgety/restless 0  0  Suicidal thoughts 2  0  PHQ-9 Score 5  9  Difficult doing work/chores Somewhat difficult  Somewhat difficult     Information is confidential and restricted. Go to Review Flowsheets to unlock data.      05/12/2022    1:06 PM 11/30/2021    9:18 AM 10/17/2021    9:01 AM 09/21/2021   11:55 AM  GAD 7 : Generalized Anxiety Score  Nervous, Anxious, on Edge 1 3 2 1   Control/stop worrying 1 1 2 1   Worry too much - different things 1 3 3 2   Trouble relaxing 0 0 1 0  Restless 0 0 0 0  Easily annoyed or irritable 0 3 0 0  Afraid - awful might happen 0 0 2 0  Total GAD 7 Score 3 10 10 4   Anxiety Difficulty Somewhat difficult Somewhat difficult Somewhat difficult Somewhat difficult       ROS As per HPI.    Objective:     BP 111/66   Pulse 91   Temp 98.1 F (36.7 C) (Temporal)   Ht 5\' 2"  (1.575 m)   Wt 175 lb 4 oz (79.5 kg)   SpO2 97%   BMI 32.05 kg/m    Physical Exam Vitals and nursing note reviewed.  Constitutional:      General: She is not in  acute distress.    Appearance: Normal appearance. She is not ill-appearing, toxic-appearing or diaphoretic.  Abdominal:     General: Bowel sounds are normal. There is no distension.     Tenderness: There is no abdominal tenderness. There is no right CVA tenderness, left CVA tenderness, guarding or rebound.  Musculoskeletal:     Right lower leg: No edema.     Left lower leg: No edema.  Skin:    General: Skin is warm and dry.  Neurological:     General: No focal deficit present.     Mental Status: She is alert and oriented to person, place, and time.  Psychiatric:        Mood and Affect: Mood normal.        Behavior: Behavior normal.      No results found for any visits on 05/12/22.    The ASCVD Risk score (Arnett DK, et al., 2019) failed to calculate for the following reasons:   The 2019 ASCVD risk score is  only valid for ages 54 to 40    Assessment & Plan:   Yolanda Villa was seen today for std screening.  Diagnoses and all orders for this visit:  Screening for STD (sexually transmitted disease) Declined labs for STI screening. Urine cytology pending. Clue cells on wet prep. Flaygl sent in for BV.  -     Ct Ng M genitalium NAA, Urine -     WET PREP FOR TRICH, YEAST, CLUE  Bacterial vaginosis -     metroNIDAZOLE (FLAGYL) 250 MG tablet; Take 2 tablets (500 mg total) by mouth 2 (two) times daily for 7 days.  Depression, recurrent (HCC) History of suicidal ideation Denies current SI, plan, or intent. Has close follow up with [facility]. Keep follow up appt. Patient is able to verbally contract for safety.   Return to office for new or worsening symptoms, or if symptoms persist.   The patient indicates understanding of these issues and agrees with the plan.   Gabriel Earing, FNP

## 2022-05-12 NOTE — Patient Instructions (Signed)

## 2022-05-14 LAB — CT NG M GENITALIUM NAA, URINE
Chlamydia trachomatis, NAA: NEGATIVE
Mycoplasma genitalium NAA: POSITIVE — AB
Neisseria gonorrhoeae, NAA: NEGATIVE

## 2022-05-15 ENCOUNTER — Telehealth: Payer: Self-pay | Admitting: Family Medicine

## 2022-05-15 ENCOUNTER — Other Ambulatory Visit: Payer: Self-pay | Admitting: Family Medicine

## 2022-05-15 DIAGNOSIS — A493 Mycoplasma infection, unspecified site: Secondary | ICD-10-CM

## 2022-05-15 MED ORDER — MOXIFLOXACIN HCL 400 MG PO TABS
400.0000 mg | ORAL_TABLET | Freq: Every day | ORAL | 0 refills | Status: AC
Start: 2022-05-22 — End: 2022-05-29

## 2022-05-15 MED ORDER — DOXYCYCLINE HYCLATE 100 MG PO TABS
100.0000 mg | ORAL_TABLET | Freq: Two times a day (BID) | ORAL | 0 refills | Status: AC
Start: 2022-05-15 — End: 2022-05-22

## 2022-05-15 NOTE — Telephone Encounter (Signed)
Pt aware of results and recommendations and voiced understanding. 

## 2022-05-18 ENCOUNTER — Telehealth (HOSPITAL_COMMUNITY): Payer: 59 | Admitting: Psychiatry

## 2022-05-18 ENCOUNTER — Encounter (HOSPITAL_COMMUNITY): Payer: Self-pay | Admitting: Psychiatry

## 2022-05-18 DIAGNOSIS — F331 Major depressive disorder, recurrent, moderate: Secondary | ICD-10-CM | POA: Diagnosis not present

## 2022-05-18 DIAGNOSIS — F411 Generalized anxiety disorder: Secondary | ICD-10-CM | POA: Diagnosis not present

## 2022-05-18 DIAGNOSIS — F4310 Post-traumatic stress disorder, unspecified: Secondary | ICD-10-CM

## 2022-05-18 DIAGNOSIS — F603 Borderline personality disorder: Secondary | ICD-10-CM | POA: Diagnosis not present

## 2022-05-18 DIAGNOSIS — G4726 Circadian rhythm sleep disorder, shift work type: Secondary | ICD-10-CM

## 2022-05-18 MED ORDER — FLUOXETINE HCL 10 MG PO CAPS
10.0000 mg | ORAL_CAPSULE | Freq: Every day | ORAL | 2 refills | Status: DC
Start: 2022-05-18 — End: 2022-11-02
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
## 2022-05-18 NOTE — Patient Instructions (Signed)
We did not make any medication changes today.  When you get a chance to please call your insurer to find a DBT provider (dialectical behavioral therapy) that is in network.

## 2022-06-09 ENCOUNTER — Encounter: Payer: Self-pay | Admitting: Family Medicine

## 2022-06-09 ENCOUNTER — Ambulatory Visit: Payer: 59 | Admitting: Family Medicine

## 2022-06-09 VITALS — BP 102/74 | HR 82 | Temp 98.2°F | Ht 62.0 in | Wt 180.0 lb

## 2022-06-09 DIAGNOSIS — L308 Other specified dermatitis: Secondary | ICD-10-CM | POA: Diagnosis not present

## 2022-06-09 DIAGNOSIS — R350 Frequency of micturition: Secondary | ICD-10-CM | POA: Diagnosis not present

## 2022-06-09 DIAGNOSIS — Z113 Encounter for screening for infections with a predominantly sexual mode of transmission: Secondary | ICD-10-CM | POA: Diagnosis not present

## 2022-06-09 DIAGNOSIS — N898 Other specified noninflammatory disorders of vagina: Secondary | ICD-10-CM

## 2022-06-09 LAB — MICROSCOPIC EXAMINATION
Bacteria, UA: NONE SEEN
Renal Epithel, UA: NONE SEEN /hpf
WBC, UA: NONE SEEN /hpf (ref 0–5)

## 2022-06-09 LAB — URINALYSIS, ROUTINE W REFLEX MICROSCOPIC
Bilirubin, UA: NEGATIVE
Glucose, UA: NEGATIVE
Ketones, UA: NEGATIVE
Leukocytes,UA: NEGATIVE
Nitrite, UA: NEGATIVE
Protein,UA: NEGATIVE
Specific Gravity, UA: <1.005 — ABNORMAL LOW (ref 1.005–1.030)
Urobilinogen, Ur: 0.2 mg/dL (ref 0.2–1.0)
pH, UA: 6 (ref 5.0–7.5)

## 2022-06-09 LAB — WET PREP FOR TRICH, YEAST, CLUE
Clue Cell Exam: NEGATIVE
Trichomonas Exam: NEGATIVE
Yeast Exam: NEGATIVE

## 2022-06-09 NOTE — Progress Notes (Addendum)
Acute Office Visit  Subjective:  Patient ID: Yolanda Villa, female    DOB: 05-21-1995, 27 y.o.   MRN: [id number]  Chief Complaint  Patient presents with   Acute Visit    Yeast Frequent urination as well   HPI Patient is in today for follow up of BV States that she has a rash on her thighs and groins. States that she works out a lot and doesn't know if its from sweating. Denies discharge, fever, back pain, burning with urination. States that there is a new smell. She has increased urination. She has not had any new sexual partners since May 2nd. She would like screening for some STI. Declines testing for G/C. Recently treated for mycoplasma genitalium. Completed course of doxycycline and moxifloxacin.   ROS As per HPI  Objective:  BP 102/74   Pulse 82   Temp 98.2 F (36.8 C) (Oral)   Ht 5\' 2"  (1.575 m)   Wt 180 lb (81.6 kg)   LMP 04/22/2022 (Approximate)   SpO2 98%   BMI 32.92 kg/m   Physical Exam Constitutional:      General: She is awake. She is not in acute distress.    Appearance: Normal appearance. She is well-developed and well-groomed. She is not ill-appearing, toxic-appearing or diaphoretic.  Cardiovascular:     Rate and Rhythm: Normal rate.     Pulses: Normal pulses.          Radial pulses are 2+ on the right side and 2+ on the left side.       Posterior tibial pulses are 2+ on the right side and 2+ on the left side.     Heart sounds: Normal heart sounds. No murmur heard.    No gallop.  Pulmonary:     Effort: Pulmonary effort is normal. No respiratory distress.     Breath sounds: Normal breath sounds. No stridor. No wheezing, rhonchi or rales.  Abdominal:     General: Abdomen is flat.     Palpations: Abdomen is soft.     Tenderness: There is no abdominal tenderness. There is no right CVA tenderness, left CVA tenderness, guarding or rebound. Negative signs include Murphy's sign and Rovsing's sign.     Hernia: No hernia is present.  Musculoskeletal:      Cervical back: Full passive range of motion without pain and neck supple.     Right lower leg: No edema.     Left lower leg: No edema.  Skin:    General: Skin is warm.     Capillary Refill: Capillary refill takes less than 2 seconds.     Findings: Erythema present.          Comments: Small, flat, erythematous areas on bilateral inner thighs without satellite lesions, without pustules or vesicles.   Neurological:     General: No focal deficit present.     Mental Status: She is alert, oriented to person, place, and time and easily aroused. Mental status is at baseline.     GCS: GCS eye subscore is 4. GCS verbal subscore is 5. GCS motor subscore is 6.     Motor: No weakness.  Psychiatric:        Attention and Perception: Attention and perception normal.        Mood and Affect: Mood and affect normal.        Speech: Speech normal.        Behavior: Behavior normal. Behavior is cooperative.  Thought Content: Thought content normal. Thought content does not include homicidal or suicidal ideation. Thought content does not include homicidal or suicidal plan.        Cognition and Memory: Cognition and memory normal.        Judgment: Judgment normal.       06/09/2022    9:27 AM 05/12/2022    1:05 PM 12/28/2021   10:47 AM  Depression screen PHQ 2/9  Decreased Interest 0 1   Down, Depressed, Hopeless 0 1   PHQ - 2 Score 0 2   Altered sleeping 0 0   Tired, decreased energy 2 0   Change in appetite 0 0   Feeling bad or failure about yourself  0 1   Trouble concentrating 0 0   Moving slowly or fidgety/restless 0 0   Suicidal thoughts 0 2   PHQ-9 Score 2 5   Difficult doing work/chores Not difficult at all Somewhat difficult      Information is confidential and restricted. Go to Review Flowsheets to unlock data.      06/09/2022    9:28 AM 05/12/2022    1:06 PM 11/30/2021    9:18 AM 10/17/2021    9:01 AM  GAD 7 : Generalized Anxiety Score  Nervous, Anxious, on Edge 1 1 3 2    Control/stop worrying 0 1 1 2   Worry too much - different things 0 1 3 3   Trouble relaxing 0 0 0 1  Restless 0 0 0 0  Easily annoyed or irritable 0 0 3 0  Afraid - awful might happen 0 0 0 2  Total GAD 7 Score 1 3 10 10   Anxiety Difficulty Not difficult at all Somewhat difficult Somewhat difficult Somewhat difficult   Assessment & Plan:  1. Urine frequency Labs as below. Will communicate results to patient once available.  Urinalysis reviewed in office with patient. No sign of infection on UA. Discussed with patient that we will await results of culture and wet prep to treat. - Urinalysis, Routine w reflex microscopic - Urine Culture - WET PREP FOR TRICH, YEAST, CLUE  2. Vaginal odor Labs as below. Will communicate results to patient once available.  Urinalysis reviewed in office with patient. No sign of infection on UA. Discussed with patient that we will await results of culture and wet prep to treat. - Urinalysis, Routine w reflex microscopic - Urine Culture - WET PREP FOR TRICH, YEAST, CLUE  3. Routine screening for STI (sexually transmitted infection) Labs as below. Will communicate results to patient once available.  - HepB+HepC+HIV Panel - RPR  4. Dermatitis associated with moisture Discussed with patient using OTC treatments. Dry, full cotton underwear, avoiding compression shorts.   The above assessment and management plan was discussed with the patient. The patient verbalized understanding of and has agreed to the management plan using shared-decision making. Patient is aware to call the clinic if they develop any new symptoms or if symptoms fail to improve or worsen. Patient is aware when to return to the clinic for a follow-up visit. Patient educated on when it is appropriate to go to the emergency department.   Return if symptoms worsen or fail to improve.  Neale Burly, DNP-FNP [facility] [address] [place], [place]  [address] [phone]

## 2022-06-10 LAB — HEPB+HEPC+HIV PANEL
HIV Screen 4th Generation wRfx: NONREACTIVE
Hep B C IgM: NEGATIVE
Hep B Core Total Ab: NEGATIVE
Hep B E Ab: NONREACTIVE
Hep B E Ag: NEGATIVE
Hep B Surface Ab, Qual: NONREACTIVE
Hep C Virus Ab: NONREACTIVE
Hepatitis B Surface Ag: NEGATIVE

## 2022-06-10 LAB — RPR: RPR Ser Ql: NONREACTIVE

## 2022-06-12 LAB — URINE CULTURE

## 2022-06-12 NOTE — Progress Notes (Signed)
Low specific gravity and trace RBC in UA, not indicative of infection. Negative for all labs. Not immune to hep B if patient would like to schedule with Triage RN for vaccinations.

## 2022-07-02 IMAGING — MR MR HEAD WO/W CM
19 series · 48 of 48 positions shown · IV contrast (7ml Multihance)
Comparison: None.

CLINICAL DATA: Infertility. Galactorrhea. Elevated prolactin level.

EXAM:
MRI HEAD WITHOUT AND WITH CONTRAST
TECHNIQUE: Multiplanar, multiecho pulse sequences of the brain and surrounding
structures were obtained without and with intravenous contrast.
CONTRAST:  7mL MULTIHANCE GADOBENATE DIMEGLUMINE 529 MG/ML IV SOLN

[Series 5: T1 · sagittal · 4.0mm · 0.72mm/px · 1 of 27 slices shown (1 of 5)]
[im 1/27]
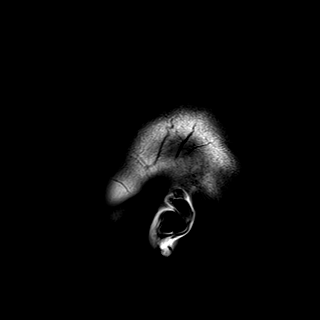

[Series 6: DWI · axial · 3.0mm · 1.44mm/px · z∈[-84,+62]mm · 6 of 80 slices shown (1 of 2)]
[im 1/80]
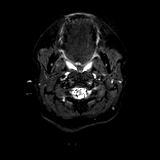
[im 16/80]
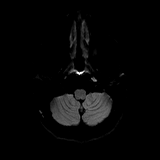
[im 32/80]
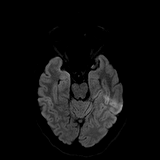
[im 48/80]
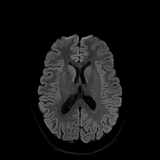
[im 64/80]
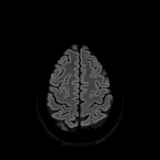
[im 80/80]
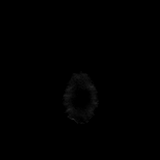

[Series 7: DWI · axial · 3.0mm · 1.44mm/px · z∈[-84,+62]mm · 3 of 40 slices shown (2 of 2)]
[im 1/40]
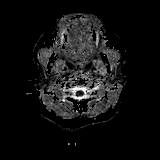
[im 20/40]
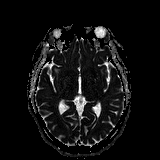
[im 40/40]
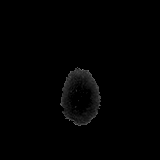

[Series 8: T2 · axial · 4.0mm · 0.36mm/px · z∈[-60,+66]mm · 2 of 26 slices shown]
[im 1/26]
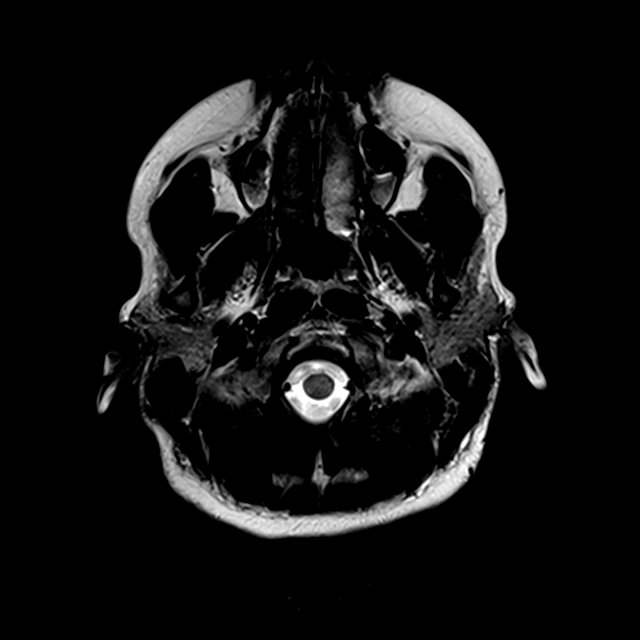
[im 26/26]
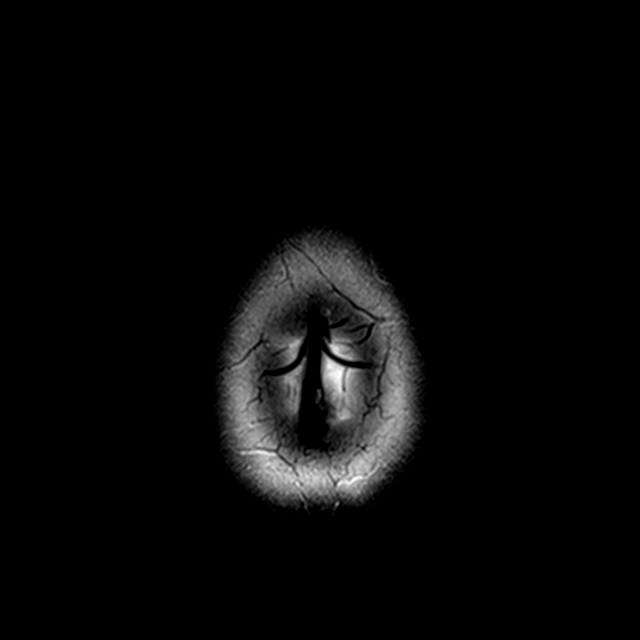

[Series 10: swi_images · axial · 1.5mm · 0.90mm/px · z∈[-69,+69]mm · 8 of 96 slices shown]
[im 1/96]
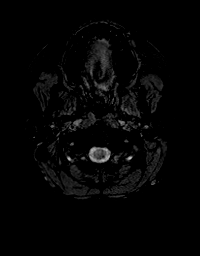
[im 14/96]
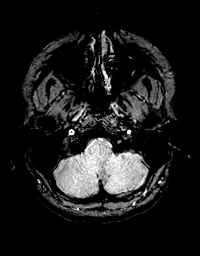
[im 28/96]
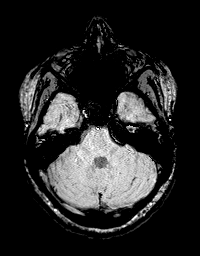
[im 41/96]
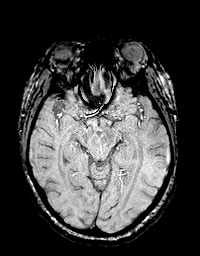
[im 55/96]
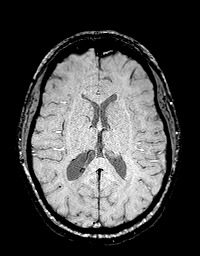
[im 68/96]
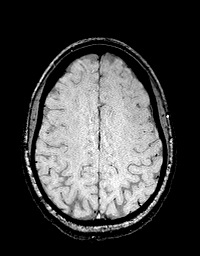
[im 82/96]
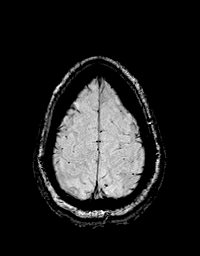
[im 96/96]
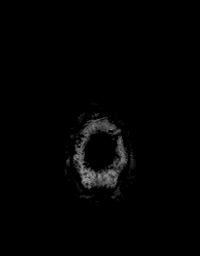

[Series 11: FLAIR · axial · 3.0mm · 0.72mm/px · z∈[-64,+70]mm · 2 of 24 slices shown]
[im 1/24]
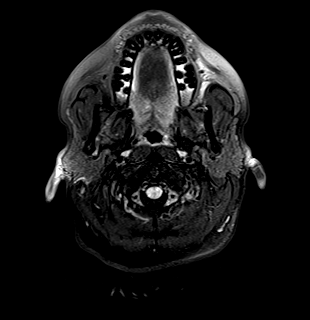
[im 24/24]
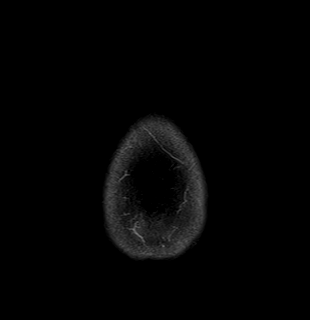

[Series 12: T1 · sagittal · 3.0mm · 0.42mm/px · 1 of 13 slices shown (2 of 5)]
[im 1/13]
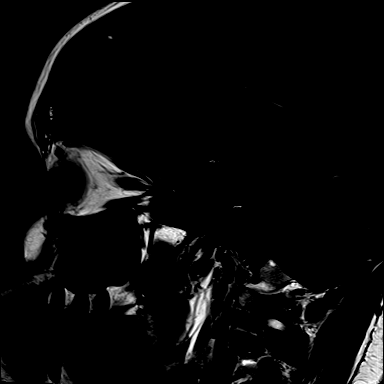

[Series 13: T1 · coronal · 3.0mm · 0.42mm/px · 1 of 10 slices shown (3 of 5)]
[im 1/10]
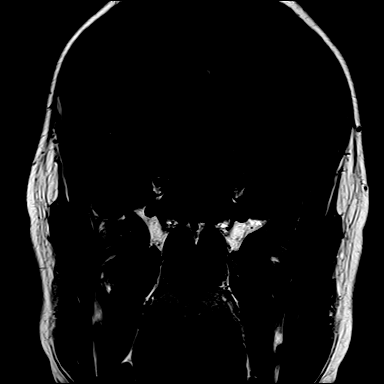

[Series 14: T1 · coronal · non-contrast · 3.0mm · 0.62mm/px · 1 of 9 slices shown (4 of 5)]
[im 1/9]
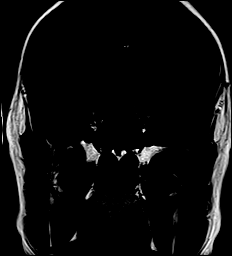

[Series 15: cor post dyn · coronal · 3.0mm · 0.62mm/px · 1 of 9 slices shown (1 of 6)]
[im 1/9]
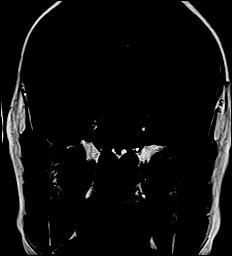

[Series 16: cor post dyn · coronal · 3.0mm · 0.62mm/px · 1 of 9 slices shown (2 of 6)]
[im 1/9]
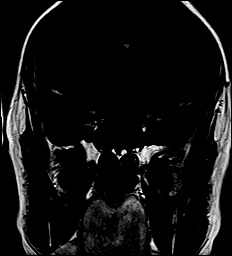

[Series 17: cor post dyn · coronal · 3.0mm · 0.62mm/px · 1 of 9 slices shown (3 of 6)]
[im 1/9]
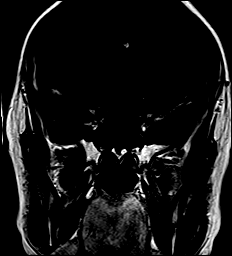

[Series 18: cor post dyn · coronal · 3.0mm · 0.62mm/px · 1 of 9 slices shown (4 of 6)]
[im 1/9]
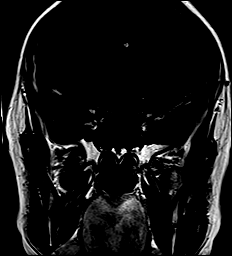

[Series 19: cor post dyn · coronal · 3.0mm · 0.62mm/px · 1 of 9 slices shown (5 of 6)]
[im 1/9]
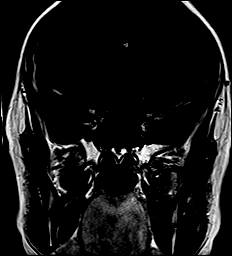

[Series 20: cor post dyn · coronal · 3.0mm · 0.62mm/px · 1 of 9 slices shown (6 of 6)]
[im 1/9]
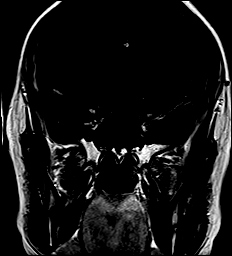

[Series 21: T1 post-contrast · sagittal · 3.0mm · 0.42mm/px · 1 of 13 slices shown (1 of 3)]
[im 1/13]
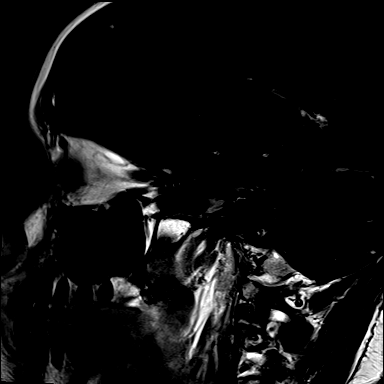

[Series 22: T1 post-contrast · coronal · 3.0mm · 0.42mm/px · 1 of 10 slices shown (2 of 3)]
[im 1/10]
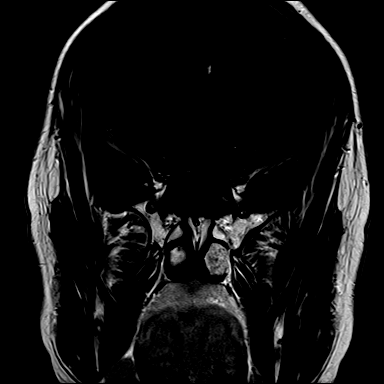

[Series 23: T1 · axial · 1.0mm · 0.86mm/px · z∈[-68,+71]mm · 12 of 144 slices shown (5 of 5)]
[im 1/144]
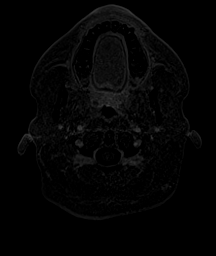
[im 14/144]
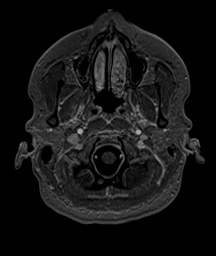
[im 27/144]
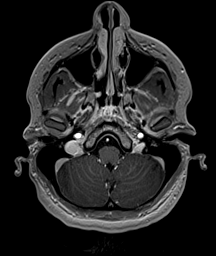
[im 40/144]
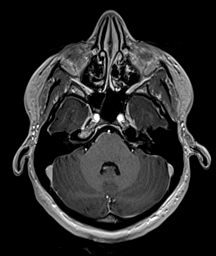
[im 53/144]
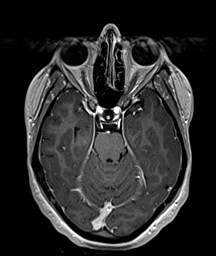
[im 66/144]
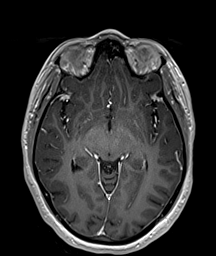
[im 79/144]
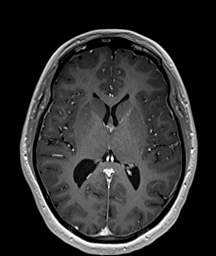
[im 92/144]
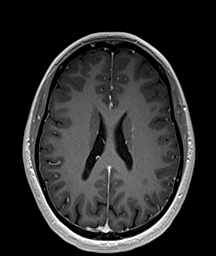
[im 105/144]
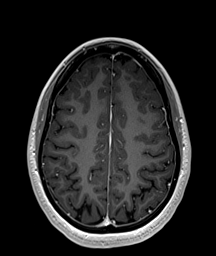
[im 118/144]
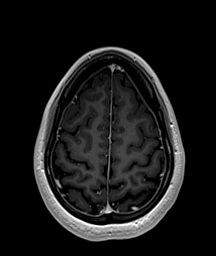
[im 131/144]
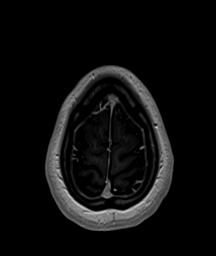
[im 144/144]
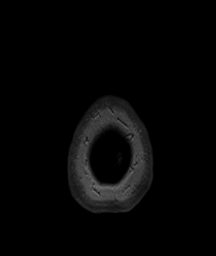

[Series 24: T1 post-contrast · coronal · 4.0mm · 0.47mm/px · 3 of 34 slices shown (3 of 3)]
[im 1/34]
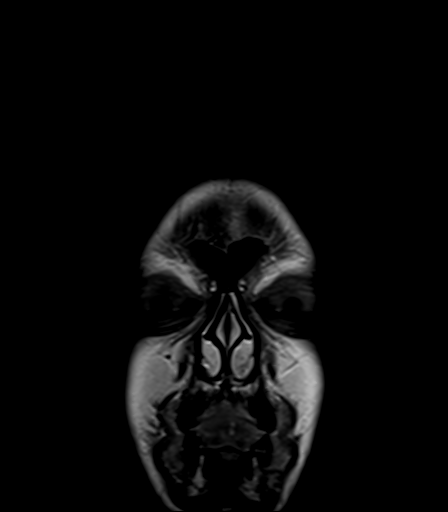
[im 17/34]
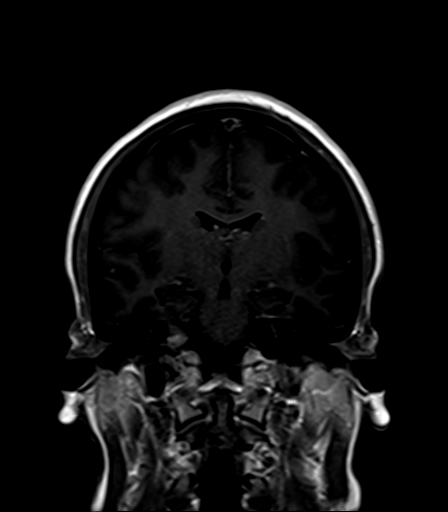
[im 34/34]
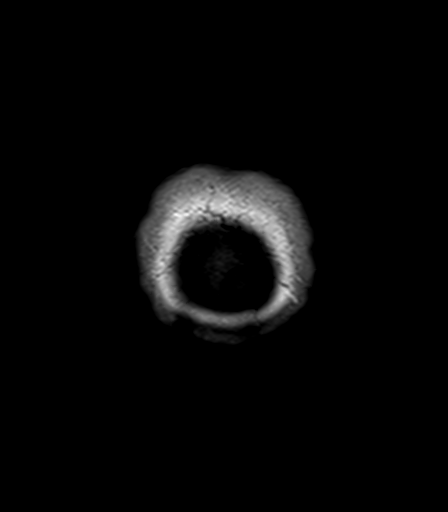

[48 of 48 positions shown; findings below may reference images not displayed]

FINDINGS: Brain: There is no evidence of an acute infarct, intracranial
hemorrhage, mass, midline shift, or extra-axial fluid collection.
Cerebral volume is normal. A benign appearing cyst slightly expands
the atrium of the right lateral ventricle, not considered to be of
any clinical significance. The brain is normal in signal. No
abnormal enhancement is identified.

Dedicated imaging was performed through the sella turcica. The
pituitary gland is overall normal in size, and there is a normal
posterior pituitary bright spot. Pituitary enhancement is mildly
heterogeneous, however there is the suggestion of an underlying
lesion in the midline of the gland both on pre and postcontrast thin
coronal images. Precise measurement of the lesion's size is limited
as its margins are not well-defined (potentially as large as 7 mm on
the delayed thin coronal postcontrast sequence (series 22, image 5)
though with the precontrast T1 sequence, whole brain coronal T1
postcontrast sequence, and lack of significant bulging of the
superior pituitary contour more suggestive of a smaller underlying
lesion (5 mm or less). Characterization on dynamic postcontrast
sequences is limited by motion. There is no regional mass effect,
and the infundibulum is midline. The cavernous sinuses and optic
chiasm are unremarkable.

Vascular: Major intracranial vascular flow voids are preserved.

Skull and upper cervical spine: Unremarkable bone marrow signal.

Sinuses/Orbits: Unremarkable orbits. Small left maxillary sinus
mucous retention cyst. Clear mastoid air cells.

Other: None.
IMPRESSION: Suspected pituitary microadenoma with detailed characterization
mildly limited as detailed above.

## 2022-11-02 ENCOUNTER — Encounter: Payer: Self-pay | Admitting: Family Medicine

## 2022-11-02 ENCOUNTER — Ambulatory Visit: Payer: 59 | Admitting: Family Medicine

## 2022-11-02 VITALS — BP 104/65 | HR 85 | Temp 98.2°F | Ht 62.0 in | Wt 185.0 lb

## 2022-11-02 DIAGNOSIS — R051 Acute cough: Secondary | ICD-10-CM | POA: Diagnosis not present

## 2022-11-02 DIAGNOSIS — N898 Other specified noninflammatory disorders of vagina: Secondary | ICD-10-CM | POA: Diagnosis not present

## 2022-11-02 MED ORDER — LEVOCETIRIZINE DIHYDROCHLORIDE 5 MG PO TABS
5.0000 mg | ORAL_TABLET | Freq: Every evening | ORAL | 3 refills | Status: DC
Start: 2022-11-02 — End: 2023-03-29

## 2022-11-02 NOTE — Progress Notes (Signed)
Acute Office Visit  Subjective:     Patient ID: Yolanda Villa, female    DOB: November 05, 1995, 27 y.o.   MRN: [id number]  Chief Complaint  Patient presents with   std screening   Cough    Cough This is a new problem. The current episode started 1 to 4 weeks ago. The problem has been gradually improving. The cough is Non-productive. Associated symptoms include a fever (last week, now afebrile) and a sore throat (now resolved). Pertinent negatives include no chest pain, chills, ear congestion, ear pain, headaches, nasal congestion, shortness of breath or wheezing. Nothing aggravates the symptoms. She has tried OTC cough suppressant (benadryl) for the symptoms. The treatment provided mild relief. There is no history of asthma, bronchitis, COPD or pneumonia.   Also reports some vaginal odor that has been intermittent. Using boracic acid prn with some improvement. Denies known exposure to STI. Has been sexually active without protection. Hx of BV and STIs.   Review of Systems  Constitutional:  Positive for fever (last week, now afebrile). Negative for chills.  HENT:  Positive for sore throat (now resolved). Negative for ear pain.   Respiratory:  Positive for cough. Negative for shortness of breath and wheezing.   Cardiovascular:  Negative for chest pain.  Neurological:  Negative for headaches.        Objective:    BP 104/65   Pulse 85   Temp 98.2 F (36.8 C) (Temporal)   Ht 5\' 2"  (1.575 m)   Wt 185 lb (83.9 kg)   SpO2 99%   BMI 33.84 kg/m    Physical Exam Vitals and nursing note reviewed.  Constitutional:      General: She is not in acute distress.    Appearance: She is not ill-appearing, toxic-appearing or diaphoretic.  HENT:     Head: Normocephalic and atraumatic.     Right Ear: Tympanic membrane, ear canal and external ear normal.     Left Ear: Tympanic membrane, ear canal and external ear normal.     Nose: Nose normal.     Right Sinus: No maxillary sinus tenderness or  frontal sinus tenderness.     Left Sinus: No maxillary sinus tenderness or frontal sinus tenderness.     Mouth/Throat:     Mouth: Mucous membranes are moist.     Pharynx: Oropharynx is clear. No oropharyngeal exudate or posterior oropharyngeal erythema.     Tonsils: No tonsillar exudate or tonsillar abscesses. 1+ on the right. 1+ on the left.  Eyes:     General:        Right eye: No discharge.        Left eye: No discharge.     Conjunctiva/sclera: Conjunctivae normal.  Cardiovascular:     Rate and Rhythm: Normal rate and regular rhythm.     Heart sounds: Normal heart sounds. No murmur heard. Pulmonary:     Effort: Pulmonary effort is normal. No respiratory distress.     Breath sounds: Normal breath sounds. No wheezing, rhonchi or rales.  Musculoskeletal:     Cervical back: Neck supple. No rigidity.     Right lower leg: No edema.     Left lower leg: No edema.  Skin:    General: Skin is warm and dry.  Neurological:     General: No focal deficit present.     Mental Status: She is alert and oriented to person, place, and time.  Psychiatric:        Mood and Affect: Mood  normal.        Behavior: Behavior normal.        Thought Content: Thought content normal.        Judgment: Judgment normal.     No results found for any visits on 11/02/22.      Assessment & Plan:   Allisen was seen today for std screening and cough.  Diagnoses and all orders for this visit:  Acute cough Discussed likely viral etiology. Discussed that cough can linger for a few weeks after viral illness. Lungs clear on exam. Try xyzal as below.  -     levocetirizine (XYZAL) 5 MG tablet; Take 1 tablet (5 mg total) by mouth every evening.  Vaginal odor NuSwab ordered.  -     NuSwab Vaginitis Plus (VG+)   Return to office for new or worsening symptoms, or if symptoms persist.   The patient indicates understanding of these issues and agrees with the plan.  Gabriel Earing, FNP

## 2022-11-05 LAB — NUSWAB VAGINITIS PLUS (VG+)
Candida albicans, NAA: NEGATIVE
Candida glabrata, NAA: NEGATIVE
Chlamydia trachomatis, NAA: NEGATIVE
Neisseria gonorrhoeae, NAA: NEGATIVE
Trich vag by NAA: NEGATIVE

## 2022-11-17 ENCOUNTER — Other Ambulatory Visit: Payer: Self-pay | Admitting: Family Medicine

## 2022-11-17 DIAGNOSIS — B009 Herpesviral infection, unspecified: Secondary | ICD-10-CM

## 2022-11-29 ENCOUNTER — Telehealth: Payer: Self-pay | Admitting: Family Medicine

## 2022-11-29 NOTE — Telephone Encounter (Signed)
Pt has a rash by her binki line she wanted to be seen today there are no appts available she would like a call back to see if she can be seen sooner

## 2022-11-29 NOTE — Telephone Encounter (Signed)
Apt scheuled

## 2022-12-01 ENCOUNTER — Other Ambulatory Visit: Payer: Self-pay | Admitting: Family Medicine

## 2022-12-01 DIAGNOSIS — B009 Herpesviral infection, unspecified: Secondary | ICD-10-CM

## 2022-12-03 NOTE — Progress Notes (Unsigned)
   Acute Office Visit  Subjective:     Patient ID: Yolanda Villa, female    DOB: 08/07/1995, 27 y.o.   MRN: [id number]  No chief complaint on file.   HPI  ROS Negative unless indicated in HPI    Objective:    There were no vitals taken for this visit. {Vitals History (Optional):23777}  Physical Exam  No results found for any visits on 12/04/22.      Assessment & Plan:  There are no diagnoses linked to this encounter.  No follow-ups on file.  Arrie Aran Santa Lighter, [facility] [address] [place], [place] [address] [phone]    Note: This document was prepared by [employer] voice dictation technology and any errors that results from this process are unintentional.

## 2022-12-04 ENCOUNTER — Ambulatory Visit: Payer: 59 | Admitting: Nurse Practitioner

## 2022-12-05 ENCOUNTER — Encounter: Payer: Self-pay | Admitting: Family Medicine

## 2023-03-16 ENCOUNTER — Emergency Department (HOSPITAL_COMMUNITY)
Admission: EM | Admit: 2023-03-16 | Discharge: 2023-03-16 | Disposition: A | Discharge status: Home / Self Care | Attending: Emergency Medicine | Admitting: Emergency Medicine

## 2023-03-16 ENCOUNTER — Other Ambulatory Visit: Payer: Self-pay

## 2023-03-16 ENCOUNTER — Encounter (HOSPITAL_COMMUNITY): Payer: Self-pay

## 2023-03-16 ENCOUNTER — Emergency Department (HOSPITAL_COMMUNITY)

## 2023-03-16 DIAGNOSIS — R072 Precordial pain: Secondary | ICD-10-CM | POA: Insufficient documentation

## 2023-03-16 DIAGNOSIS — R0789 Other chest pain: Secondary | ICD-10-CM | POA: Diagnosis present

## 2023-03-16 LAB — CBC
HCT: 36.7 % (ref 36.0–46.0)
Hemoglobin: 12.6 g/dL (ref 12.0–15.0)
MCH: 30.1 pg (ref 26.0–34.0)
MCHC: 34.3 g/dL (ref 30.0–36.0)
MCV: 87.8 fL (ref 80.0–100.0)
Platelets: 306 10*3/uL (ref 150–400)
RBC: 4.18 MIL/uL (ref 3.87–5.11)
RDW: 11.9 % (ref 11.5–15.5)
WBC: 9.6 10*3/uL (ref 4.0–10.5)
nRBC: 0 % (ref 0.0–0.2)

## 2023-03-16 LAB — BASIC METABOLIC PANEL
Anion gap: 10 (ref 5–15)
BUN: 7 mg/dL (ref 6–20)
CO2: 23 mmol/L (ref 22–32)
Calcium: 9.5 mg/dL (ref 8.9–10.3)
Chloride: 104 mmol/L (ref 98–111)
Creatinine, Ser: 0.56 mg/dL (ref 0.44–1.00)
GFR, Estimated: >60 mL/min (ref >60)
Glucose, Bld: 93 mg/dL (ref 70–99)
Potassium: 4 mmol/L (ref 3.5–5.1)
Sodium: 137 mmol/L (ref 135–145)

## 2023-03-16 LAB — TROPONIN I (HIGH SENSITIVITY): Troponin I (High Sensitivity): <2 ng/L (ref <18)

## 2023-03-16 LAB — HCG, SERUM, QUALITATIVE: Preg, Serum: NEGATIVE

## 2023-03-16 NOTE — ED Provider Notes (Signed)
 [place] EMERGENCY DEPARTMENT AT [facility] Provider Note   CSN: [id number] Arrival date & time: 03/16/23  9147     History  Chief Complaint  Patient presents with   Chest Pain    Yolanda Villa is a 28 y.o. female.  The history is provided by the patient.  Patient presents with chest pain.  Patient works at the [employer] She reports around 1:15 AM she started having a pinching in her left chest.  No pleuritic pain.  She reports when she bent over she felt mildly short of breath.  She is now feeling improved.  No fevers or vomiting.  No hemoptysis.  She is a non-smoker. She does report she is getting fertility treatments, but no acute issues from that.  No previous history of CAD/VTE No recent trauma.  Denies any rash or bruising or mass to her chest   Past Medical History:  Diagnosis Date   Anxiety    Dysmenorrhea    Heart murmur    History of chlamydia 09/11/2018   STD (sexually transmitted disease)    Suicidal ideation 04/17/2022    Home Medications Prior to Admission medications   Medication Sig Start Date End Date Taking? Authorizing Provider  D3-50 1.25 MG (50000 UT) capsule Take 50,000 Units by mouth once a week. 04/08/22   [provider]  levocetirizine (XYZAL) 5 MG tablet Take 1 tablet (5 mg total) by mouth every evening. 11/02/22   Gabriel Earing, FNP  valACYclovir (VALTREX) 500 MG tablet Take 1 tablet (500 mg total) by mouth daily. 12/04/22   Gabriel Earing, FNP      Allergies    Patient has no known allergies.    Review of Systems   Review of Systems  Constitutional:  Negative for fever.  Respiratory:  Positive for shortness of breath. Negative for cough.   Cardiovascular:  Positive for chest pain.    Physical Exam Updated Vital Signs BP 112/78   Pulse 80   Temp 97.8 F (36.6 C)   Resp 18   Ht 1.575 m (5\' 2" )   Wt 83.9 kg   LMP 02/16/2023   SpO2 100%   BMI 33.84 kg/m  Physical Exam CONSTITUTIONAL:  Well developed/well nourished, no distress using her phone HEAD: Normocephalic/atraumatic ENMT: Mucous membranes moist NECK: supple no meningeal signs CV: S1/S2 noted, no murmurs/rubs/gallops noted LUNGS: Lungs are clear to auscultation bilaterally, no apparent distress Chest-mild tenderness to palpation ABDOMEN: soft, nontender NEURO: Pt is awake/alert/appropriate, moves all extremitiesx4.  No facial droop.   EXTREMITIES: pulses normal/equal, full ROM, no lower extremity edema or tenderness SKIN: warm, color normal PSYCH: no abnormalities of mood noted, alert and oriented to situation  ED Results / Procedures / Treatments   Labs (all labs ordered are listed, but only abnormal results are displayed) Labs Reviewed  BASIC METABOLIC PANEL  CBC  HCG, SERUM, QUALITATIVE  TROPONIN I (HIGH SENSITIVITY)    EKG EKG Interpretation Date/Time:  Friday March 16 2023 02:33:43 EDT Ventricular Rate:  90 PR Interval:  156 QRS Duration:  92 QT Interval:  352 QTC Calculation: 430 R Axis:   51  Text Interpretation: Normal sinus rhythm Incomplete right bundle branch block Borderline ECG No significant change since last tracing Confirmed by Zadie Rhine ([id number]) on 03/16/2023 4:23:31 AM  Radiology DG Chest 2 View Result Date: 03/16/2023 CLINICAL DATA:  Left-sided chest pain. EXAM: CHEST - 2 VIEW COMPARISON:  September 15, 2015 FINDINGS: The heart size and mediastinal  contours are within normal limits. Both lungs are clear. The visualized skeletal structures are unremarkable. IMPRESSION: No active cardiopulmonary disease. Electronically Signed   By: Aram Candela M.D.   On: 03/16/2023 03:29    Procedures Procedures    Medications Ordered in ED Medications - No data to display  ED Course/ Medical Decision Making/ A&P             HEART Score: 0                    Medical Decision Making Amount and/or Complexity of Data Reviewed Labs: ordered. Radiology: ordered.   This patient  presents to the ED for concern of chest pain, this involves an extensive number of treatment options, and is a complaint that carries with it a high risk of complications and morbidity.  The differential diagnosis includes but is not limited to acute coronary syndrome, aortic dissection, pulmonary embolism, pericarditis, pneumothorax, pneumonia, myocarditis, pleurisy, esophageal rupture   Comorbidities that complicate the patient evaluation: Patient's presentation is complicated by their history of anxiety  Lab Tests: I Ordered, and personally interpreted labs.  The pertinent results include: Labs unremarkable  Imaging Studies ordered: I ordered imaging studies including X-ray chest   I independently visualized and interpreted imaging which showed no acute findings I agree with the radiologist interpretation  Test Considered: Patient is low risk / negative by heart score, therefore do not feel that cardiac admission or workup is indicated.   Reevaluation: After the interventions noted above, I reevaluated the patient and found that they have :improved  Complexity of problems addressed: Patient's presentation is most consistent with  acute presentation with potential threat to life or bodily function  Disposition: After consideration of the diagnostic results and the patient's response to treatment,  I feel that the patent would benefit from discharge   .    Patient reports feeling improved.  She is in no distress.  Workup overall unremarkable.  I have low suspicion for PE at this time        Final Clinical Impression(s) / ED Diagnoses Final diagnoses:  Precordial pain    Rx / DC Orders ED Discharge Orders     None         Zadie Rhine, MD 03/16/23 [phone]

## 2023-03-16 NOTE — ED Triage Notes (Signed)
 Pt reports intermittent left sided chest pain "twinges in my heart" associated with shortness of breath when she bent over to pick something up. Onset tonight around 0115.

## 2023-03-16 NOTE — Discharge Instructions (Signed)

## 2023-03-16 NOTE — ED Notes (Signed)
 Pt reports her pregnancy test may come back positive because she took a HCG shot today. SHe reports she has PCOS and is trying to get pregnant and the shots will cause her pregnancy test to be a false positive.

## 2023-03-29 ENCOUNTER — Ambulatory Visit: Admitting: Family Medicine

## 2023-03-29 ENCOUNTER — Encounter: Payer: Self-pay | Admitting: Family Medicine

## 2023-03-29 VITALS — BP 104/68 | HR 90 | Temp 98.5°F | Ht 62.0 in | Wt 193.0 lb

## 2023-03-29 DIAGNOSIS — N898 Other specified noninflammatory disorders of vagina: Secondary | ICD-10-CM

## 2023-03-29 LAB — URINALYSIS, ROUTINE W REFLEX MICROSCOPIC
Bilirubin, UA: NEGATIVE
Glucose, UA: NEGATIVE
Ketones, UA: NEGATIVE
Nitrite, UA: NEGATIVE
Protein,UA: NEGATIVE
Specific Gravity, UA: 1.02 (ref 1.005–1.030)
Urobilinogen, Ur: 0.2 mg/dL (ref 0.2–1.0)
pH, UA: 6.5 (ref 5.0–7.5)

## 2023-03-29 LAB — WET PREP FOR TRICH, YEAST, CLUE
Clue Cell Exam: POSITIVE — AB
Trichomonas Exam: NEGATIVE
Yeast Exam: NEGATIVE

## 2023-03-29 LAB — MICROSCOPIC EXAMINATION
Renal Epithel, UA: NONE SEEN /HPF
Yeast, UA: NONE SEEN

## 2023-03-29 MED ORDER — METRONIDAZOLE 500 MG PO TABS: 500.0000 mg | ORAL_TABLET | Freq: Two times a day (BID) | ORAL | 0 refills | Status: DC

## 2023-03-29 NOTE — Progress Notes (Signed)
 Sent in metronidazole for BV. Will await other results.

## 2023-03-29 NOTE — Progress Notes (Signed)
 Subjective:  Patient ID: Yolanda Villa, female    DOB: [birth year], 28 y.o.   MRN: [id number]  Patient Care Team: Gabriel Earing, FNP as PCP - General (Family Medicine)   Chief Complaint:  possible std exposure  HPI: Yolanda Villa is a 28 y.o. female presenting on 03/29/2023 for possible std exposure States that she had rash yesterday. Reports that today is gone. Reports that she notices a smell before her period starts. Denies known exposure. One partner in the last 60 days. Reports history of BV. 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 Denies burning, itching, nausea, vomiting, incomplete emptying, hesitancy, urgency. No new low back pain. Reports some increased discharged, started menses yesterday.    Relevant past medical, surgical, family, and social history reviewed and updated as indicated.  Allergies and medications reviewed and updated. Data reviewed: Chart in Epic.   Past Medical History:  Diagnosis Date   Anxiety    Dysmenorrhea    Heart murmur    History of chlamydia 09/11/2018   STD (sexually transmitted disease)    Suicidal ideation 04/17/2022    Past Surgical History:  Procedure Laterality Date   WISDOM TOOTH EXTRACTION  10/2015   WISDOM TOOTH EXTRACTION  2017    Social History   Socioeconomic History   Marital status: Single    Spouse name: Not on file   Number of children: 0   Years of education: 14   Highest education level: Associate degree: academic program  Occupational History   Occupation: Location manager  Tobacco Use   Smoking status: Never   Smokeless tobacco: Never  Vaping Use   Vaping status: Never Used  Substance and Sexual Activity   Alcohol use: Not Currently    Comment: Will have a couple shots socially for special events.  None currently   Drug use: Not Currently    Types: Marijuana    Comment: Smoked marijuana in high school   Sexual activity: Yes    Partners: Male    Birth control/protection: None  Other Topics  Concern   Not on file  Social History Narrative   Not on file   Social Drivers of Health   Financial Resource Strain: Not on file  Food Insecurity: Not on file  Transportation Needs: Not on file  Physical Activity: Not on file  Stress: Not on file  Social Connections: Not on file  Intimate Partner Violence: Not on file    Outpatient Encounter Medications as of 03/29/2023  Medication Sig   letrozole (FEMARA) 2.5 MG tablet SMARTSIG:3 Tablet(s) By Mouth   valACYclovir (VALTREX) 500 MG tablet Take 1 tablet (500 mg total) by mouth daily.   [DISCONTINUED] D3-50 1.25 MG (50000 UT) capsule Take 50,000 Units by mouth once a week.   [DISCONTINUED] levocetirizine (XYZAL) 5 MG tablet Take 1 tablet (5 mg total) by mouth every evening.   No facility-administered encounter medications on file as of 03/29/2023.    No Known Allergies  Review of Systems As per HPI  Objective:  BP 104/68   Pulse 90   Temp 98.5 F (36.9 C)   Ht 5\' 2"  (1.575 m)   Wt 193 lb (87.5 kg)   LMP 03/29/2023   SpO2 96%   BMI 35.30 kg/m    Wt Readings from Last 3 Encounters:  03/29/23 193 lb (87.5 kg)  03/16/23 185 lb (83.9 kg)  11/02/22 185 lb (83.9 kg)    Physical Exam Constitutional:      General: She  is awake. She is not in acute distress.    Appearance: Normal appearance. She is well-developed and well-groomed. She is not ill-appearing, toxic-appearing or diaphoretic.  Cardiovascular:     Rate and Rhythm: Normal rate and regular rhythm.     Pulses: Normal pulses.          Radial pulses are 2+ on the right side and 2+ on the left side.       Posterior tibial pulses are 2+ on the right side and 2+ on the left side.     Heart sounds: Normal heart sounds. No murmur heard.    No gallop.  Pulmonary:     Effort: Pulmonary effort is normal. No respiratory distress.     Breath sounds: Normal breath sounds. No stridor. No wheezing, rhonchi or rales.  Abdominal:     General: Abdomen is flat. Bowel sounds are  normal. There is no distension.     Palpations: Abdomen is soft. There is no mass.     Tenderness: There is no abdominal tenderness. There is no right CVA tenderness, left CVA tenderness, guarding or rebound.     Hernia: No hernia is present.  Musculoskeletal:     Cervical back: Full passive range of motion without pain and neck supple.     Right lower leg: No edema.     Left lower leg: No edema.  Skin:    General: Skin is warm.     Capillary Refill: Capillary refill takes less than 2 seconds.  Neurological:     General: No focal deficit present.     Mental Status: She is alert, oriented to person, place, and time and easily aroused. Mental status is at baseline.     GCS: GCS eye subscore is 4. GCS verbal subscore is 5. GCS motor subscore is 6.     Motor: No weakness.  Psychiatric:        Attention and Perception: Attention and perception normal.        Mood and Affect: Mood and affect normal.        Speech: Speech normal.        Behavior: Behavior normal. Behavior is cooperative.        Thought Content: Thought content normal. Thought content does not include homicidal or suicidal ideation. Thought content does not include homicidal or suicidal plan.        Cognition and Memory: Cognition and memory normal.        Judgment: Judgment normal.     Results for orders placed or performed during the hospital encounter of 03/16/23  Basic metabolic panel   Collection Time: 03/16/23  2:41 AM  Result Value Ref Range   Sodium 137 135 - 145 mmol/L   Potassium 4.0 3.5 - 5.1 mmol/L   Chloride 104 98 - 111 mmol/L   CO2 23 22 - 32 mmol/L   Glucose, Bld 93 70 - 99 mg/dL   BUN 7 6 - 20 mg/dL   Creatinine, Ser 1.91 0.44 - 1.00 mg/dL   Calcium 9.5 8.9 - 47.8 mg/dL   GFR, Estimated >29 >56 mL/min   Anion gap 10 5 - 15  CBC   Collection Time: 03/16/23  2:41 AM  Result Value Ref Range   WBC 9.6 4.0 - 10.5 K/uL   RBC 4.18 3.87 - 5.11 MIL/uL   Hemoglobin 12.6 12.0 - 15.0 g/dL   HCT 21.3 08.6 -  57.8 %   MCV 87.8 80.0 - 100.0 fL   MCH 30.1  26.0 - 34.0 pg   MCHC 34.3 30.0 - 36.0 g/dL   RDW 16.1 09.6 - 04.5 %   Platelets 306 150 - 400 K/uL   nRBC 0.0 0.0 - 0.2 %  hCG, serum, qualitative   Collection Time: 03/16/23  2:41 AM  Result Value Ref Range   Preg, Serum NEGATIVE NEGATIVE  Troponin I (High Sensitivity)   Collection Time: 03/16/23  2:41 AM  Result Value Ref Range   Troponin I (High Sensitivity) <2 <18 ng/L       03/29/2023   11:46 AM 06/09/2022    9:27 AM 05/12/2022    1:05 PM 12/28/2021   10:47 AM 11/30/2021    9:17 AM  Depression screen PHQ 2/9  Decreased Interest 0 0 1  2  Down, Depressed, Hopeless 1 0 1  1  PHQ - 2 Score 1 0 2  3  Altered sleeping 1 0 0  2  Tired, decreased energy 1 2 0  0  Change in appetite 0 0 0  2  Feeling bad or failure about yourself  1 0 1  2  Trouble concentrating 0 0 0  0  Moving slowly or fidgety/restless 0 0 0  0  Suicidal thoughts 0 0 2  0  PHQ-9 Score 4 2 5  9   Difficult doing work/chores Somewhat difficult Not difficult at all Somewhat difficult  Somewhat difficult     Information is confidential and restricted. Go to Review Flowsheets to unlock data.       03/29/2023   11:48 AM 06/09/2022    9:28 AM 05/12/2022    1:06 PM 11/30/2021    9:18 AM  GAD 7 : Generalized Anxiety Score  Nervous, Anxious, on Edge 1 1 1 3   Control/stop worrying 1 0 1 1  Worry too much - different things 1 0 1 3  Trouble relaxing 0 0 0 0  Restless 0 0 0 0  Easily annoyed or irritable 0 0 0 3  Afraid - awful might happen 0 0 0 0  Total GAD 7 Score 3 1 3 10   Anxiety Difficulty Not difficult at all Not difficult at all Somewhat difficult Somewhat difficult   Pertinent labs & imaging results that were available during my care of the patient were reviewed by me and considered in my medical decision making.  Assessment & Plan:  Jakeisha was seen today for possible std exposure.  Diagnoses and all orders for this visit:  Vaginal odor Based on  results of wet prep, will treat with abx as below. Will await results of culture and Nuswab to determine next steps.  -     NuSwab Vaginitis Plus (VG+) -     WET PREP FOR TRICH, YEAST, CLUE -     Urine Culture -     Urinalysis, Routine w reflex microscopic -     metroNIDAZOLE (FLAGYL) 500 MG tablet; Take 1 tablet (500 mg total) by mouth 2 (two) times daily for 7 days.   Continue all other maintenance medications.  Follow up plan: Return if symptoms worsen or fail to improve.   Continue healthy lifestyle choices, including diet (rich in fruits, vegetables, and lean proteins, and low in salt and simple carbohydrates) and exercise (at least 30 minutes of moderate physical activity daily).  Written and verbal instructions provided   The above assessment and management plan was discussed with the patient. The patient verbalized understanding of and has agreed to the management plan. Patient is aware to  call the clinic if they develop any new symptoms or if symptoms persist or worsen. Patient is aware when to return to the clinic for a follow-up visit. Patient educated on when it is appropriate to go to the emergency department.   Neale Burly, DNP-FNP [facility] [address] [place], [place] [address] [phone]

## 2023-03-30 ENCOUNTER — Telehealth: Payer: Self-pay

## 2023-03-30 DIAGNOSIS — A749 Chlamydial infection, unspecified: Secondary | ICD-10-CM

## 2023-03-30 DIAGNOSIS — N898 Other specified noninflammatory disorders of vagina: Secondary | ICD-10-CM

## 2023-03-30 LAB — URINE CULTURE

## 2023-03-30 MED ORDER — METRONIDAZOLE 250 MG PO TABS
500.0000 mg | ORAL_TABLET | Freq: Two times a day (BID) | ORAL | 0 refills | Status: AC
Start: 2023-03-30 — End: 2023-04-06

## 2023-03-30 NOTE — Telephone Encounter (Signed)
 Please review and advise.

## 2023-03-30 NOTE — Telephone Encounter (Signed)
 Sent in the 250 mg tablets so that thy are smaller

## 2023-03-30 NOTE — Telephone Encounter (Signed)
 Copied from CRM [id number]. Topic: Clinical - Prescription Issue >> Mar 30, 2023 10:08 AM Carlatta H wrote: Reason for CRM: Patient would like a different prescription called in other than metronidazole//She stated the pills are to big and they make her sick

## 2023-03-30 NOTE — Addendum Note (Signed)
 Addended by: Gabriel Earing on: 03/30/2023 12:03 PM   Modules accepted: Orders

## 2023-04-01 LAB — NUSWAB VAGINITIS PLUS (VG+)
Atopobium vaginae: HIGH {score} — AB
BVAB 2: HIGH {score} — AB
Candida albicans, NAA: NEGATIVE
Candida glabrata, NAA: NEGATIVE
Megasphaera 1: HIGH {score} — AB
Neisseria gonorrhoeae, NAA: NEGATIVE
Trich vag by NAA: NEGATIVE

## 2023-04-02 MED ORDER — DOXYCYCLINE HYCLATE 100 MG PO TABS: 100.0000 mg | ORAL_TABLET | Freq: Two times a day (BID) | ORAL | 0 refills | Status: AC

## 2023-04-02 NOTE — Progress Notes (Signed)
 Complete medications that Yolanda Villa sent in. Follow up if symptoms continue or do not resolve.

## 2023-04-02 NOTE — Telephone Encounter (Signed)
 Copied from CRM [id number]. Topic: Clinical - Lab/Test Results >> Apr 02, 2023  9:09 AM Macon Large wrote: Reason for CRM: Patient stated that she saw her results but wanted to discuss whether Jerrel Ivory will be sending in a Rx for a new medication to her pharmacy. Patient requests call back to discuss. Call back# [phone]

## 2023-04-02 NOTE — Telephone Encounter (Signed)
 Doxycyline BID x 7 days for positive chlamydia. No sexually activity until completed treatment and asymptomatic. Notify any partners. Please report to health department. Complete flagyl for BV.

## 2023-04-02 NOTE — Telephone Encounter (Signed)
 Pt aware of new medication and all recommendations.

## 2023-04-02 NOTE — Addendum Note (Signed)
 Addended by: Gabriel Earing on: 04/02/2023 02:34 PM   Modules accepted: Orders

## 2023-04-10 ENCOUNTER — Ambulatory Visit: Payer: Self-pay

## 2023-04-10 NOTE — Telephone Encounter (Signed)
 Chief Complaint: back pain, lab result follow up questions Symptoms: lower back pain, abdominal pain, diarrhea Frequency: intermittent Pertinent Negatives: Patient denies burning with urination, fever, numbness, weakness Disposition: [] ED /[] Urgent Care (no appt availability in office) / [] Appointment(In office/virtual)/ []  [facility]/ [] Home Care/ [] Refused Recommended Disposition /[] [facility]/ [x]  Follow-up with PCP Additional Notes: Patient calling in asking further about her chlamydia testing. She would like to know if she can come in for retesting. Advised she will need to speak with her provider, CDC recommends 3 months post treatment for follow up testing. Patient states she would like to know the source of how she contracted the chlamydia. Patient is concerned because her partner tested negative and she states they have not cheated on each other and have been together for over a year. Patient states concerns due to her test back in October 2024 was negative for chlamydia. She is wondering if her boyfriends urine test was a false negative and she started asking if she could have caught the STI from a vaginal ultrasound if they did not properly clean the probe. Asked if patient's symptoms have improved since office visit and treatment. She states she completed her antibiotics this morning and started having intermittent lower back pain and was wondering if she needs to have the antibiotic extended. Patient states she had some intermittent abdominal pain and diarrhea with the antibiotics.   Copied from CRM [id number]. Topic: Clinical - Lab/Test Results >> Apr 10, 2023  9:05 AM Franchot Heidelberg wrote: Reason for CRM: Pt has questions about her lab results  Wants to speak to a nurse  Best contact: [phone] Reason for Disposition  Caller requesting lab results  (Exception: Routine or non-urgent lab result.)  Back pain  Answer Assessment - Initial Assessment Questions 1.  REASON FOR CALL or QUESTION: "What is your reason for calling today?" or "How can I best help you?" or "What question do you have that I can help answer?"     Patient states she has been with her partner for a year and a half. She is concerned because her chlamydia was negative back in October. She states she had her partner get tested and his urine sample was negative for chlamydia. She states he told her he has not cheated on her. She would like to know the source of where she caught chlamydia and if his test could have been a false negative. She states she thinks she might have caught it off a vaginal ultrasound. She is requesting if she can be tested again for chlamydia after treatment, wants to know how soon she can be tested.  2. CALLER: Document the source of call. (e.g., laboratory, patient).     Patient.  Answer Assessment - Initial Assessment Questions 1. ONSET: "When did the pain begin?"      This morning. 2. LOCATION: "Where does it hurt?" (upper, mid or lower back)     Lower.  3. SEVERITY: "How bad is the pain?"  (e.g., Scale 1-10; mild, moderate, or severe)   - MILD (1-3): Doesn't interfere with normal activities.    - MODERATE (4-7): Interferes with normal activities or awakens from sleep.    - SEVERE (8-10): Excruciating pain, unable to do any normal activities.      5/10, denies taking any medication.  4. PATTERN: "Is the pain constant?" (e.g., yes, no; constant, intermittent)      Intermittent.  5. RADIATION: "Does the pain shoot into your legs or somewhere  else?"     Denies.  6. CAUSE:  "What do you think is causing the back pain?"      Unsure,   7. BACK OVERUSE:  "Any recent lifting of heavy objects, strenuous work or exercise?"     Denies.  8. MEDICINES: "What have you taken so far for the pain?" (e.g., nothing, acetaminophen, NSAIDS)     None.  9. NEUROLOGIC SYMPTOMS: "Do you have any weakness, numbness, or problems with bowel/bladder control?"      Denies.  10. OTHER SYMPTOMS: "Do you have any other symptoms?" (e.g., fever, abdomen pain, burning with urination, blood in urine)       Abdominal pain with diarrhea (she states occurred after first few doses of antibiotic).  11. PREGNANCY: "Is there any chance you are pregnant?" "When was your last menstrual period?"       LMP: 03/29/23.  Protocols used: PCP Call - No Triage-A-AH, Back Pain-A-AH

## 2023-04-11 NOTE — Telephone Encounter (Signed)
 Lmtcb to schedule appt in office with Tiffany to discuss.

## 2023-04-12 ENCOUNTER — Ambulatory Visit: Admitting: Family Medicine

## 2023-04-12 VITALS — BP 112/67 | HR 79 | Temp 97.9°F | Ht 62.0 in | Wt 189.2 lb

## 2023-04-12 DIAGNOSIS — Z124 Encounter for screening for malignant neoplasm of cervix: Secondary | ICD-10-CM

## 2023-04-12 DIAGNOSIS — B9689 Other specified bacterial agents as the cause of diseases classified elsewhere: Secondary | ICD-10-CM | POA: Diagnosis not present

## 2023-04-12 DIAGNOSIS — N760 Acute vaginitis: Secondary | ICD-10-CM

## 2023-04-12 DIAGNOSIS — Z7251 High risk heterosexual behavior: Secondary | ICD-10-CM | POA: Diagnosis not present

## 2023-04-12 NOTE — Progress Notes (Signed)
   Acute Office Visit  Subjective:     Patient ID: EVONDA ENGE, female    DOB: 05-25-1995, 28 y.o.   MRN: [id number]  Chief Complaint  Patient presents with   Results    HPI Patient is in today for follow up of positive chlamydia test on 03/29/23. Completed treatment with doxycyline. Also had BV and completed treatment with flagyl. She has had sex with her partner. Per her partner, he was negative when tested with urine and was not treated for chlamydia. Reports that this is the only partner she has had for the past year. She had a negative screening in October of 2024 for chlamydia. She denies any current symptoms. Partner is adamant that he has not had any sexual activity with anyone but [place]. Isatou wonders if there is anywhere else the infection could have come from.  Would like referral to OB//GYN to establish for paps. Also has recurrent BV infections.    ROS As per HPI.      Objective:    BP 112/67   Pulse 79   Temp 97.9 F (36.6 C) (Temporal)   Ht 5\' 2"  (1.575 m)   Wt 189 lb 3.2 oz (85.8 kg)   LMP 03/29/2023   SpO2 97%   BMI 34.61 kg/m    Physical Exam Vitals and nursing note reviewed.  Constitutional:      General: She is not in acute distress.    Appearance: Normal appearance. She is not ill-appearing.  Pulmonary:     Effort: Pulmonary effort is normal.  Abdominal:     General: Bowel sounds are normal. There is no distension.     Palpations: Abdomen is soft. There is no mass.     Tenderness: There is no abdominal tenderness. There is no guarding or rebound.  Musculoskeletal:     Right lower leg: No edema.     Left lower leg: No edema.  Skin:    General: Skin is warm and dry.     Findings: No lesion or rash.  Neurological:     General: No focal deficit present.     Mental Status: She is alert and oriented to person, place, and time.  Psychiatric:        Mood and Affect: Mood normal.        Behavior: Behavior normal.     No results found for  any visits on 04/12/23.      Assessment & Plan:   Noemy was seen today for results.  Diagnoses and all orders for this visit:  High risk heterosexual behavior Will repeat today as she has resumed sexual activity with her partner who was not treated. Discussed sexual activity as the source of chlamydia and that she was negative for this in October of 2024. Discussed that she has contracted this through sexual activity sometime in the last 6 months. Discussed condoms to prevent STIs.  -     Ct Ng M genitalium NAA, Urine  Cervical cancer screening Bacterial vaginosis Hx of recurrent BV. Referral discussed and placed.  -     Ambulatory referral to Obstetrics / Gynecology   Return if symptoms worsen or fail to improve.  The patient indicates understanding of these issues and agrees with the plan.  Albertha Huger, FNP

## 2023-04-13 NOTE — Telephone Encounter (Signed)
 Pt was seen in office yesterday with PCP regarding this, will close encounter.

## 2023-04-14 ENCOUNTER — Other Ambulatory Visit: Payer: Self-pay | Admitting: Family Medicine

## 2023-04-14 DIAGNOSIS — B009 Herpesviral infection, unspecified: Secondary | ICD-10-CM

## 2023-04-14 LAB — CT NG M GENITALIUM NAA, URINE
Chlamydia trachomatis, NAA: NEGATIVE
Mycoplasma genitalium NAA: NEGATIVE
Neisseria gonorrhoeae, NAA: NEGATIVE

## 2023-04-16 ENCOUNTER — Encounter: Payer: Self-pay | Admitting: Family Medicine

## 2023-05-03 ENCOUNTER — Ambulatory Visit: Admitting: Women's Health

## 2023-05-03 ENCOUNTER — Other Ambulatory Visit (HOSPITAL_COMMUNITY)
Admission: RE | Admit: 2023-05-03 | Discharge: 2023-05-03 | Disposition: A | Discharge status: Home / Self Care | Source: Ambulatory Visit | Attending: Advanced Practice Midwife | Admitting: Advanced Practice Midwife

## 2023-05-03 ENCOUNTER — Encounter: Payer: Self-pay | Admitting: Women's Health

## 2023-05-03 VITALS — BP 109/67 | HR 78 | Ht 62.0 in | Wt 186.6 lb

## 2023-05-03 DIAGNOSIS — Z319 Encounter for procreative management, unspecified: Secondary | ICD-10-CM

## 2023-05-03 DIAGNOSIS — Z0000 Encounter for general adult medical examination without abnormal findings: Secondary | ICD-10-CM | POA: Diagnosis present

## 2023-05-03 DIAGNOSIS — Z124 Encounter for screening for malignant neoplasm of cervix: Secondary | ICD-10-CM

## 2023-05-03 DIAGNOSIS — E282 Polycystic ovarian syndrome: Secondary | ICD-10-CM | POA: Diagnosis not present

## 2023-05-03 DIAGNOSIS — N760 Acute vaginitis: Secondary | ICD-10-CM | POA: Diagnosis not present

## 2023-05-03 DIAGNOSIS — Z113 Encounter for screening for infections with a predominantly sexual mode of transmission: Secondary | ICD-10-CM | POA: Diagnosis not present

## 2023-05-03 NOTE — Progress Notes (Signed)
 GYN VISIT Patient name: Yolanda Villa MRN [id number]  Date of birth: 1995-02-28 Chief Complaint:   Annual Exam (pap)  History of Present Illness:   Yolanda Villa is a 28 y.o. G39P0010 female being seen today for recurrent BV. No current sx, gets q few months. Wants STD screen. Has PCOS, on letrozole w/ fertility clinic trying to conceive, does IUI next week.    No LMP recorded. The current method of family planning is none.  Last pap 12/16/18. Results were: NILM w/ HRHPV not done     05/03/2023    3:32 PM 04/12/2023   11:29 AM 03/29/2023   11:46 AM 06/09/2022    9:27 AM 05/12/2022    1:05 PM  Depression screen PHQ 2/9  Decreased Interest 0 1 0 0 1  Down, Depressed, Hopeless 1 1 1  0 1  PHQ - 2 Score 1 2 1  0 2  Altered sleeping 2 0 1 0 0  Tired, decreased energy 3 1 1 2  0  Change in appetite 0 0 0 0 0  Feeling bad or failure about yourself  2 1 1  0 1  Trouble concentrating 0 0 0 0 0  Moving slowly or fidgety/restless 0 0 0 0 0  Suicidal thoughts 0 0 0 0 2  PHQ-9 Score 8 4 4 2 5   Difficult doing work/chores  Somewhat difficult Somewhat difficult Not difficult at all Somewhat difficult        05/03/2023    3:32 PM 04/12/2023   11:30 AM 03/29/2023   11:48 AM 06/09/2022    9:28 AM  GAD 7 : Generalized Anxiety Score  Nervous, Anxious, on Edge 0 1 1 1   Control/stop worrying 1 1 1  0  Worry too much - different things 2 1 1  0  Trouble relaxing 0 0 0 0  Restless 0 0 0 0  Easily annoyed or irritable 1 0 0 0  Afraid - awful might happen 0 0 0 0  Total GAD 7 Score 4 3 3 1   Anxiety Difficulty  Not difficult at all Not difficult at all Not difficult at all     Review of Systems:   Pertinent items are noted in HPI Denies fever/chills, dizziness, headaches, visual disturbances, fatigue, shortness of breath, chest pain, abdominal pain, vomiting, abnormal vaginal discharge/itching/odor/irritation, problems with periods, bowel movements, urination, or intercourse unless otherwise stated above.   Pertinent History Reviewed:  Reviewed past medical,surgical, social, obstetrical and family history.  Reviewed problem list, medications and allergies. Physical Assessment:   Vitals:   05/03/23 1521  BP: 109/67  Pulse: 78  Weight: 186 lb 9.6 oz (84.6 kg)  Height: 5\' 2"  (1.575 m)  Body mass index is 34.13 kg/m.       Physical Examination:   General appearance: alert, well appearing, and in no distress  Mental status: alert, oriented to person, place, and time  Skin: warm & dry   Cardiovascular: normal heart rate noted  Respiratory: normal respiratory effort, no distress  Abdomen: soft, non-tender   Pelvic: VULVA: normal appearing vulva with no masses, tenderness or lesions, VAGINA: normal appearing vagina with normal color and discharge, no lesions, CERVIX: normal appearing cervix without discharge or lesions  Extremities: no edema   Chaperone: Steffanie Edouard  No results found for this or any previous visit (from the past 24 hours).  Assessment & Plan:  1) Recurrent BV> no current sx, CV swab, gave printed tips, can try URO  2) STD screen> CV swab  3) Cervical cancer screening> pap today  4) PCOS, trying to conceive> on letrozole, does IUI next week  Meds: No orders of the defined types were placed in this encounter.   No orders of the defined types were placed in this encounter.   Return in about 1 year (around 05/02/2024) for Physical.  Ferd Householder CNM, [facility] 05/03/2023 3:40 PM

## 2023-05-03 NOTE — Patient Instructions (Signed)
 Recurrent Vaginitis Alternative Therapies Consider making the changes below:  Soap: Unscented Dove (white box light green writing)  Wash cloth: use a separate white washcloth for your genital area Laundry detergent: Dreft or unscented Arm n' Hammer  Underwear: White 100% cotton panties (NOT just cotton crouch) Sanitary pads/tampons: Unscented only-If it doesn't SAY unscented it can have a scent/perfume    NO PERFUMES OR LOTIONS OR POTIONS in the genital area (may use regular KY) Condoms: hypoallergenic only, non-dyed (no color) Toilet paper: White unscented only  URO vaginal probiotic

## 2023-05-07 ENCOUNTER — Encounter: Payer: Self-pay | Admitting: Women's Health

## 2023-05-07 LAB — CERVICOVAGINAL ANCILLARY ONLY
Bacterial Vaginitis (gardnerella): NEGATIVE
Candida Glabrata: NEGATIVE
Candida Vaginitis: POSITIVE — AB
Comment: NEGATIVE
Comment: NEGATIVE
Comment: NEGATIVE

## 2023-05-07 MED ORDER — FLUCONAZOLE 150 MG PO TABS
150.0000 mg | ORAL_TABLET | Freq: Once | ORAL | 0 refills | Status: AC
Start: 2023-05-07 — End: 2023-05-07

## 2023-05-07 NOTE — Addendum Note (Signed)
 Addended by: Ferd Householder on: 05/07/2023 01:33 PM   Modules accepted: Orders

## 2023-05-09 ENCOUNTER — Telehealth: Payer: Self-pay | Admitting: Women's Health

## 2023-05-09 ENCOUNTER — Encounter: Payer: Self-pay | Admitting: Women's Health

## 2023-05-09 DIAGNOSIS — R87619 Unspecified abnormal cytological findings in specimens from cervix uteri: Secondary | ICD-10-CM | POA: Insufficient documentation

## 2023-05-09 LAB — CYTOLOGY - PAP
Chlamydia: NEGATIVE
Comment: NEGATIVE
Comment: NEGATIVE
Comment: NEGATIVE
Comment: NEGATIVE
Comment: NEGATIVE
Comment: NORMAL
Diagnosis: UNDETERMINED — AB
HPV 16: NEGATIVE
HPV 18 / 45: NEGATIVE
High risk HPV: POSITIVE — AB
Neisseria Gonorrhea: NEGATIVE
Trichomonas: NEGATIVE

## 2023-05-09 NOTE — Telephone Encounter (Signed)
Attempted to call back no answer

## 2023-05-09 NOTE — Telephone Encounter (Signed)
 Called patient to set up appointment for colpo. Patient would like somebody to answer some questions about her results, then we will get her scheduled. Please advise.

## 2023-06-04 ENCOUNTER — Ambulatory Visit: Admitting: Obstetrics & Gynecology

## 2023-06-04 ENCOUNTER — Encounter: Payer: Self-pay | Admitting: Obstetrics & Gynecology

## 2023-06-04 ENCOUNTER — Other Ambulatory Visit (HOSPITAL_COMMUNITY)
Admission: RE | Admit: 2023-06-04 | Discharge: 2023-06-04 | Disposition: A | Discharge status: Home / Self Care | Source: Ambulatory Visit | Attending: Obstetrics & Gynecology | Admitting: Obstetrics & Gynecology

## 2023-06-04 VITALS — BP 127/64 | HR 83 | Ht 62.0 in | Wt 179.0 lb

## 2023-06-04 DIAGNOSIS — Z3202 Encounter for pregnancy test, result negative: Secondary | ICD-10-CM | POA: Diagnosis not present

## 2023-06-04 DIAGNOSIS — R87810 Cervical high risk human papillomavirus (HPV) DNA test positive: Secondary | ICD-10-CM

## 2023-06-04 DIAGNOSIS — R87610 Atypical squamous cells of undetermined significance on cytologic smear of cervix (ASC-US): Secondary | ICD-10-CM | POA: Insufficient documentation

## 2023-06-04 DIAGNOSIS — Z708 Other sex counseling: Secondary | ICD-10-CM | POA: Diagnosis not present

## 2023-06-04 LAB — POCT URINE PREGNANCY: Preg Test, Ur: NEGATIVE

## 2023-06-04 NOTE — Progress Notes (Signed)
 Patient name: CHRISTIN MOLINE MRN [id number]  Date of birth: 1995-11-28 Chief Complaint:   Colposcopy  History of Present Illness:   MONTE BRONDER is a 28 y.o. G70P0010  female being seen today for cervical dysplasia management.  Sexually active- being seen @ [facility] recent IUI Notes h/o irregular menses.  Denies regular discharge, itching or irritation.  Denies pelvic or abdominal pain.  Smoker:  No. New sexual partner:  Yes.     Prior cytology:  05/2023: ASCUS, HPV positive- other Last Pap in 2020 normal  Pt notes h/o HSV- prescribed valtrex , but not taking.  Notes initial outbreak but nothing since that time   No LMP recorded.     05/03/2023    3:32 PM 04/12/2023   11:29 AM 03/29/2023   11:46 AM 06/09/2022    9:27 AM 05/12/2022    1:05 PM  Depression screen PHQ 2/9  Decreased Interest 0 1 0 0 1  Down, Depressed, Hopeless 1 1 1  0 1  PHQ - 2 Score 1 2 1  0 2  Altered sleeping 2 0 1 0 0  Tired, decreased energy 3 1 1 2  0  Change in appetite 0 0 0 0 0  Feeling bad or failure about yourself  2 1 1  0 1  Trouble concentrating 0 0 0 0 0  Moving slowly or fidgety/restless 0 0 0 0 0  Suicidal thoughts 0 0 0 0 2  PHQ-9 Score 8 4 4 2 5   Difficult doing work/chores  Somewhat difficult Somewhat difficult Not difficult at all Somewhat difficult     Review of Systems:   Pertinent items are noted in HPI Denies fever/chills, dizziness, headaches, visual disturbances, fatigue, shortness of breath, chest pain, abdominal pain, vomiting Pertinent History Reviewed:  Reviewed past medical,surgical, social, obstetrical and family history.  Reviewed problem list, medications and allergies. Physical Assessment:   Vitals:   06/04/23 1409  BP: 127/64  Pulse: 83  Weight: 179 lb (81.2 kg)  Height: 5\' 2"  (1.575 m)  Body mass index is 32.74 kg/m.       Physical Examination:   General appearance: alert, well appearing, and in no distress  Psych: mood appropriate, normal  affect  Skin: warm & dry   Cardiovascular: normal heart rate noted  Respiratory: normal respiratory effort, no distress  Abdomen: soft, non-tender   Pelvic: VULVA: normal appearing vulva with no masses, tenderness or lesions, VAGINA: normal appearing vagina with normal color and discharge, no lesions, CERVIX: See colposcopy section  Extremities: no edema   Chaperone: Lorean Rodes     Colposcopy Procedure Note  Indications: ASCUS, HPV+    Procedure Details  The risks and benefits of the procedure and Written informed consent obtained.  Speculum placed in vagina and excellent visualization of cervix achieved, cervix swabbed x 3 with acetic acid solution.  Findings: Adequate colposcopy is noted today.  TMZ zone visualized  Cervix: acetowhite lesion(s) noted and mosaicism noted at 12 o'clock; ECC and cervical biopsies obtained.    Monsel's applied.  Adequate hemostasis noted  Specimens: ECC and cervical biopsies  Complications: none.  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  Discussed HSV vs HPV- diagnosis, management and treatment -questions/concerns were addressed  Maxim Bedel, DO Attending Obstetrician & Gynecologist, [facility] for [facility], [facility]

## 2023-06-06 LAB — SURGICAL PATHOLOGY

## 2023-06-07 ENCOUNTER — Encounter: Payer: Self-pay | Admitting: Obstetrics & Gynecology

## 2023-06-08 ENCOUNTER — Ambulatory Visit: Payer: Self-pay | Admitting: Obstetrics & Gynecology

## 2023-06-08 ENCOUNTER — Telehealth: Payer: Self-pay | Admitting: Obstetrics & Gynecology

## 2023-06-08 NOTE — Telephone Encounter (Signed)
-  pt called with results of recent colposcopy -reviewed ASCCP guidelines -in light of young age and future desire for pregnancy, advised conservative management of high grade dysplasia  []  plan for pap/colposcopy in 50 and 12 mos  -discussed fortunes of follow-up - Questions and concerns were addressed and patient agreeable to above  Yolanda Mirkin, DO Attending Obstetrician & Gynecologist, [facility] for [employer], [facility]

## 2023-08-01 ENCOUNTER — Ambulatory Visit: Admitting: Obstetrics & Gynecology

## 2023-08-01 ENCOUNTER — Encounter: Payer: Self-pay | Admitting: Obstetrics & Gynecology

## 2023-08-01 VITALS — BP 118/80 | HR 84

## 2023-08-01 DIAGNOSIS — Z319 Encounter for procreative management, unspecified: Secondary | ICD-10-CM

## 2023-08-01 DIAGNOSIS — E282 Polycystic ovarian syndrome: Secondary | ICD-10-CM | POA: Diagnosis not present

## 2023-08-01 DIAGNOSIS — Z3169 Encounter for other general counseling and advice on procreation: Secondary | ICD-10-CM | POA: Diagnosis not present

## 2023-08-01 NOTE — Progress Notes (Signed)
   GYN VISIT Patient name: Yolanda Villa MRN [id number]  Date of birth: 1995-01-12 Chief Complaint:   wants Vitamin D checked  History of Present Illness:   Yolanda Villa is a 28 y.o. G63P0020 female being seen today for 2nd opinion regarding fertility/family planning.  Patient has been seen at [facility].  She has been on letrozole and trigger shots and unfortunately has not conceived.  In the past she has also completed IUI.  She notes that her partner still needs to complete the semen analysis she presents today for second opinion regarding management options.  In review patient has a history of PCOS.     Patient's last menstrual period was 07/15/2023.    Review of Systems:   Pertinent items are noted in HPI Denies fever/chills, dizziness, headaches, visual disturbances, fatigue, shortness of breath, chest pain, abdominal pain, vomiting Pertinent History Reviewed:   Past Surgical History:  Procedure Laterality Date   WISDOM TOOTH EXTRACTION  10/2015   WISDOM TOOTH EXTRACTION  2017    Past Medical History:  Diagnosis Date   Anxiety    Dysmenorrhea    Heart murmur    History of chlamydia 09/11/2018   Microadenoma    PCOS (polycystic ovarian syndrome)    STD (sexually transmitted disease)    Suicidal ideation 04/17/2022   Reviewed problem list, medications and allergies. Physical Assessment:   Vitals:   08/01/23 1354  BP: 118/80  Pulse: 84  There is no height or weight on file to calculate BMI.       Physical Examination:   General appearance: alert, well appearing, and in no distress  Psych: mood appropriate, normal affect  Skin: warm & dry   Cardiovascular: normal heart rate noted  Respiratory: normal respiratory effort, no distress  Extremities: no edema   Chaperone: N/A    Assessment & Plan:  1) Fertility/family planning - Reassured patient that REI to their best intent desires conception - Reviewed that there would be no additional lab work  outside of what REI has already completed that would be helpful - Encourage prenatal vitamin daily and recommendations per REI - Questions and concerns were addressed   No orders of the defined types were placed in this encounter.   Return in 1 year (on 07/31/2024) for 12yr annual May 2026.   Daniela Hernan, DO Attending Obstetrician & Gynecologist, [facility] for [employer], [facility]

## 2023-09-20 ENCOUNTER — Encounter: Payer: Self-pay | Admitting: Family Medicine

## 2023-09-20 ENCOUNTER — Ambulatory Visit: Admitting: Family Medicine

## 2023-09-20 ENCOUNTER — Ambulatory Visit: Payer: Self-pay | Admitting: Family Medicine

## 2023-09-20 VITALS — BP 118/77 | HR 75 | Temp 96.7°F | Ht 62.0 in | Wt 191.8 lb

## 2023-09-20 DIAGNOSIS — R21 Rash and other nonspecific skin eruption: Secondary | ICD-10-CM

## 2023-09-20 DIAGNOSIS — B9689 Other specified bacterial agents as the cause of diseases classified elsewhere: Secondary | ICD-10-CM

## 2023-09-20 DIAGNOSIS — N898 Other specified noninflammatory disorders of vagina: Secondary | ICD-10-CM

## 2023-09-20 DIAGNOSIS — N760 Acute vaginitis: Secondary | ICD-10-CM

## 2023-09-20 LAB — WET PREP FOR TRICH, YEAST, CLUE
Clue Cell Exam: POSITIVE — AB
Trichomonas Exam: NEGATIVE
Yeast Exam: NEGATIVE

## 2023-09-20 MED ORDER — CLINDAMYCIN HCL 300 MG PO CAPS: 300.0000 mg | ORAL_CAPSULE | Freq: Two times a day (BID) | ORAL | 0 refills | Status: AC
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
## 2023-09-20 NOTE — Patient Instructions (Addendum)
 A&D ointment   Healthy vaginal hygiene practices   -  Avoid sleeper pajamas. Nightgowns allow air to circulate.  Sleep without underpants whenever possible.  -  Wear cotton underpants during the day. Double-rinse underwear after washing to avoid residual irritants. Do not use fabric softeners for underwear and swimsuits.  - Avoid tights, leotards, leggings, skinny jeans, and other tight-fitting clothing. Skirts and loose-fitting pants allow air to circulate.  - Avoid pantyliners.  Instead use tampons or cotton pads.  - Daily warm bathing is helpful:     - Soak in clean water (no soap) for 10 to 15 minutes.     - Use soap to wash regions other than the genital area just before getting out of the tub or drain water and take a shower to wash your body. Limit use of any soap on genital areas. Use   fragance-free soaps.     - Rinse the genital area well and gently pat dry.  Don't rub.  Hair dryer to assist with drying can be used only if on cool setting.     - Do not use bubble baths or perfumed soaps.  - Do not use any feminine sprays, douches or powders.  These contain chemicals that will irritate the skin.  - If the genital area is tender or swollen, cool compresses may relieve the discomfort. Unscented wet wipes can be used instead of toilet paper for wiping.   - Emollients, such as Vaseline, may help protect skin and can be applied to the irritated area.  - Always remember to wipe front-to-back after bowel movements. Pat dry after urination.  - Do not sit in wet swimsuits for long periods of time after swimming

## 2023-10-02 ENCOUNTER — Encounter: Payer: Self-pay | Admitting: Family Medicine

## 2023-10-02 ENCOUNTER — Ambulatory Visit: Payer: Self-pay

## 2023-10-02 ENCOUNTER — Ambulatory Visit (INDEPENDENT_AMBULATORY_CARE_PROVIDER_SITE_OTHER): Admitting: Nurse Practitioner

## 2023-10-02 ENCOUNTER — Encounter: Payer: Self-pay | Admitting: Nurse Practitioner

## 2023-10-02 VITALS — BP 99/66 | HR 78 | Temp 97.0°F | Ht 62.0 in | Wt 192.6 lb

## 2023-10-02 DIAGNOSIS — N760 Acute vaginitis: Secondary | ICD-10-CM

## 2023-10-02 DIAGNOSIS — N898 Other specified noninflammatory disorders of vagina: Secondary | ICD-10-CM | POA: Insufficient documentation

## 2023-10-02 DIAGNOSIS — B9689 Other specified bacterial agents as the cause of diseases classified elsewhere: Secondary | ICD-10-CM

## 2023-10-02 LAB — WET PREP FOR TRICH, YEAST, CLUE
Clue Cell Exam: POSITIVE — AB
Trichomonas Exam: NEGATIVE
Yeast Exam: NEGATIVE

## 2023-10-02 MED ORDER — FLUCONAZOLE 150 MG PO TABS: 150.0000 mg | ORAL_TABLET | Freq: Once | ORAL | 0 refills | Status: AC

## 2023-10-02 MED ORDER — METRONIDAZOLE 0.75 % EX GEL: 1.0000 | Freq: Two times a day (BID) | CUTANEOUS | 0 refills | Status: DC

## 2023-10-02 NOTE — Telephone Encounter (Signed)
 Copied from CRM #[id number]. Topic: Clinical - Pink Word Triage >> Oct 02, 2023  7:40 AM Rosaria E wrote: Reason for Triage: Pt is on an antibiotic and believes she has a yeast infection. Says she expressed this to her PCP during their recent visit and her PCP agreed to calling diflucan  in for her but never did. She is symptomatic now and does not want to have to be seen for an additional appt just for this. Please advise   [id number] - [place], [place] - [address] AT [facility] [address] [place] [address] Phone: [phone] Fax: [phone]  Best contact: [phone] >> Oct 02, 2023  7:44 AM Rosaria BRAVO wrote: Pt is on an antibiotic and believes she has a yeast infection. Says she expressed this to her PCP during their recent visit and her PCP agreed to calling diflucan  in for her but never did. She is symptomatic now and does not want to have to be seen for an additional appt just for this. Please advise   [id number] - [place], [place] - [address] AT [facility] [address] [place] [address] Phone: [phone] Fax: [phone]  Best contact: [phone]
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
## 2023-10-02 NOTE — Telephone Encounter (Signed)
 Copied from CRM #[id number]. Topic: Clinical - Pink Word Triage >> Oct 02, 2023  7:40 AM Rosaria E wrote: Reason for Triage: Pt is on an antibiotic and believes she has a yeast infection. Says she expressed this to her PCP during their recent visit and her PCP agreed to calling diflucan  in for her but never did. She is symptomatic now and does not want to have to be seen for an additional appt just for this. Please advise    [id number] - [place], [place] - [address] AT [facility] [address] [place] [address] Phone: [phone] Fax: [phone]   Best contact: [phone]

## 2023-10-02 NOTE — Telephone Encounter (Signed)
 Appt made.

## 2023-10-02 NOTE — Telephone Encounter (Signed)
 FYI Only or Action Required?: Action required by provider: update on patient condition.  Patient was last seen in primary care on 09/20/2023 by Severa Rock HERO, FNP.  Called Nurse Triage reporting Vaginitis.  Symptoms began several days ago.  Interventions attempted: Nothing.  Symptoms are: unchanged.  Triage Disposition: Call PCP Within 24 Hours  Patient/caregiver understands and will follow disposition?: Yes  **See note below**      Message from [place] E sent at 10/02/2023  7:44 AM EDT  Summary: Possible Yeast infection   Pt is on an antibiotic and believes she has a yeast infection. Says she expressed this to her PCP during their recent visit and her PCP agreed to calling diflucan  in for her but never did. She is symptomatic now and does not want to have to be seen for an additional appt just for this. Please advise  [id number] - [place], [place] - [address] AT [facility] [address] [place] [address] Phone: [phone] Fax: [phone]  Best contact: [phone]      Reason for Disposition  [1] Caller requests to speak ONLY to PCP AND [2] NON-URGENT question  Answer Assessment - Initial Assessment Questions 1. REASON FOR CALL or QUESTION: What is your reason for calling today? or How can I best   Patient seen on 9/18 in office for BV and was advised by the PCP a rx. Would be written for oral diflucan  for her yeast infection, patient does not want an appt. Just the medication called in.  Protocols used: PCP Call - No Triage-A-AH

## 2023-10-03 ENCOUNTER — Ambulatory Visit: Payer: Self-pay | Admitting: Nurse Practitioner

## 2023-12-12 ENCOUNTER — Ambulatory Visit: Admitting: Adult Health

## 2023-12-12 ENCOUNTER — Encounter: Payer: Self-pay | Admitting: Adult Health

## 2023-12-12 ENCOUNTER — Other Ambulatory Visit (HOSPITAL_COMMUNITY)
Admission: RE | Admit: 2023-12-12 | Discharge: 2023-12-12 | Disposition: A | Source: Ambulatory Visit | Attending: Adult Health | Admitting: Adult Health

## 2023-12-12 VITALS — BP 127/83 | HR 80 | Ht 62.0 in | Wt 194.5 lb

## 2023-12-12 DIAGNOSIS — R87810 Cervical high risk human papillomavirus (HPV) DNA test positive: Secondary | ICD-10-CM | POA: Insufficient documentation

## 2023-12-12 DIAGNOSIS — Z113 Encounter for screening for infections with a predominantly sexual mode of transmission: Secondary | ICD-10-CM | POA: Insufficient documentation

## 2023-12-12 DIAGNOSIS — Z124 Encounter for screening for malignant neoplasm of cervix: Secondary | ICD-10-CM | POA: Insufficient documentation

## 2023-12-12 DIAGNOSIS — R87610 Atypical squamous cells of undetermined significance on cytologic smear of cervix (ASC-US): Secondary | ICD-10-CM | POA: Insufficient documentation

## 2023-12-12 DIAGNOSIS — N871 Moderate cervical dysplasia: Secondary | ICD-10-CM | POA: Insufficient documentation

## 2023-12-12 NOTE — Progress Notes (Signed)
°  Subjective:     Patient ID: Yolanda Villa, female   DOB: [birth year], 28 y.o.   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  PCP is Annabella Search FNP  Review of Systems For 6 month pap Not having sex Reviewed past medical,surgical, social and family history. Reviewed medications and allergies.     Objective:   Physical Exam BP 127/83 (BP Location: Left Arm, Patient Position: Sitting, Cuff Size: Normal)   Pulse 80   Ht 5' 2 (1.575 m)   Wt 194 lb 8 oz (88.2 kg)   LMP 11/20/2023 (Approximate)   BMI 35.57 kg/m     Skin warm and dry.Pelvic: external genitalia is normal in appearance no lesions, vagina: pink,urethra has no lesions or masses noted, cervix:smooth.pap with GC/CHL and HR HPV genotyping performed, uterus: normal size, shape and contour, non tender, no masses felt, adnexa: no masses or tenderness noted. Bladder is non tender and no masses felt.  Fall risk is low  [facility] - 12/12/23 1602       Pregnancy Intention Screening   Does the patient want to become pregnant in the next year? Ok Either Way    Does the patient's partner want to become pregnant in the next year? Ok Either Way    Would the patient like to discuss contraceptive options today? No      Contraception Wrap Up   Current Method Abstinence    End Method Abstinence;Female Condom;Withdrawal or Other Method         Examination chaperoned by Clarita Salt LPN  Assessment:     1. ASCUS with positive high risk HPV cervical Pap sent - Cytology - PAP( [place])  2. Screen for STD (sexually transmitted disease) GC/CHL on pap  - Cytology - PAP( )  3. Pap smear for cervical cancer screening (Primary) Pap sent  Will message when results back  4. High grade squamous intraepithelial lesion (HGSIL), grade 2 CIN, on biopsy of cervix Pap sent      Plan:      Follow up in 6 months for pap or sooner if needed

## 2023-12-14 ENCOUNTER — Encounter: Payer: Self-pay | Admitting: Family Medicine

## 2023-12-14 ENCOUNTER — Ambulatory Visit: Payer: Self-pay | Admitting: *Deleted

## 2023-12-14 ENCOUNTER — Ambulatory Visit: Admitting: Nurse Practitioner

## 2023-12-14 ENCOUNTER — Encounter: Payer: Self-pay | Admitting: Nurse Practitioner

## 2023-12-14 VITALS — BP 120/73 | HR 84 | Temp 97.4°F | Ht 62.0 in | Wt 195.0 lb

## 2023-12-14 DIAGNOSIS — F32A Depression, unspecified: Secondary | ICD-10-CM | POA: Diagnosis not present

## 2023-12-14 DIAGNOSIS — F419 Anxiety disorder, unspecified: Secondary | ICD-10-CM | POA: Diagnosis not present

## 2023-12-14 MED ORDER — CITALOPRAM HYDROBROMIDE 20 MG PO TABS: 20.0000 mg | ORAL_TABLET | Freq: Every day | ORAL | 3 refills | Status: AC

## 2023-12-14 NOTE — Progress Notes (Signed)
 Subjective:    Patient ID: Yolanda Villa, female    DOB: 1995/03/26, 28 y.o.   MRN: [id number]   Chief Complaint: anxiety   HPI  Patient in today c/o anxiety. This has been going on fo years. Use to see DR. Stenson, who is no longer practicing in the area. She has not been to [facility] behavior health since last June. Her stress level has increased. Has had a recent break up and she is not handling things well.  She needs referral to psych. Currently on no meds. use to be on lexapro  but made her hair start thinning. She has nevr tired any other meds. When asked if she is suicidal she says she has thoughts but never makes any type of plan.    12/14/2023    3:51 PM 05/03/2023    3:32 PM 04/12/2023   11:29 AM  Depression screen PHQ 2/9  Decreased Interest 2 0 1  Down, Depressed, Hopeless 2 1 1   PHQ - 2 Score 4 1 2   Altered sleeping 0 2 0  Tired, decreased energy 0 3 1  Change in appetite 0 0 0  Feeling bad or failure about yourself  2 2 1   Trouble concentrating 0 0 0  Moving slowly or fidgety/restless 0 0 0  Suicidal thoughts 3 0 0  PHQ-9 Score 9 8  4    Difficult doing work/chores Somewhat difficult  Somewhat difficult     Data saved with a previous flowsheet row definition      12/14/2023    3:51 PM 05/03/2023    3:32 PM 04/12/2023   11:30 AM 03/29/2023   11:48 AM  GAD 7 : Generalized Anxiety Score  Nervous, Anxious, on Edge 3 0 1 1  Control/stop worrying 3 1 1 1   Worry too much - different things 3 2 1 1   Trouble relaxing 0 0 0 0  Restless 0 0 0 0  Easily annoyed or irritable 3 1 0 0  Afraid - awful might happen 2 0 0 0  Total GAD 7 Score 14 4 3 3   Anxiety Difficulty Somewhat difficult  Not difficult at all Not difficult at all     Patient Active Problem List   Diagnosis Date Noted   Pap smear for cervical cancer screening 12/12/2023   Screen for STD (sexually transmitted disease) 12/12/2023   ASCUS with positive high risk HPV cervical 12/12/2023   High grade squamous  intraepithelial lesion (HGSIL), grade 2 CIN, on biopsy of cervix 12/12/2023   Vaginal discharge 10/02/2023   BV (bacterial vaginosis) 10/02/2023   Abnormal Pap smear of cervix 05/09/2023   PCOS (polycystic ovarian syndrome) 04/18/2022   Borderline personality disorder (HCC) 12/28/2021   PTSD (post-traumatic stress disorder) 12/28/2021   Circadian rhythm sleep disorder, 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  Eyes:  Negative for pain.  Respiratory:  Negative for shortness of breath.   Cardiovascular:  Negative for chest pain, palpitations and leg swelling.  Gastrointestinal:  Negative for abdominal pain.  Endocrine: Negative for polydipsia.  Skin:  Negative for rash.  Neurological:  Negative for dizziness, weakness and headaches.  Hematological:  Does not bruise/bleed easily.  All  other systems reviewed and are negative.      Objective:   Physical Exam Vitals and nursing note reviewed.  Constitutional:      General: She is not in acute distress.    Appearance: Normal appearance. She is well-developed.  Neck:     Vascular: No carotid bruit or JVD.  Cardiovascular:     Rate and Rhythm: Normal rate and regular rhythm.     Heart sounds: Normal heart sounds.  Pulmonary:     Effort: Pulmonary effort is normal. No respiratory distress.     Breath sounds: Normal breath sounds. No wheezing or rales.  Chest:     Chest wall: No tenderness.  Abdominal:     General: Bowel sounds are normal. There is no distension or abdominal bruit.     Palpations: Abdomen is soft. There is no hepatomegaly, splenomegaly, mass or pulsatile mass.     Tenderness: There  is no abdominal tenderness.  Musculoskeletal:        General: Normal range of motion.     Cervical back: Normal range of motion and neck supple.  Lymphadenopathy:     Cervical: No cervical adenopathy.  Skin:    General: Skin is warm and dry.  Neurological:     Mental Status: She is alert and oriented to person, place, and time.     Deep Tendon Reflexes: Reflexes are normal and symmetric.  Psychiatric:        Behavior: Behavior normal.        Thought Content: Thought content normal.        Judgment: Judgment normal.     BP 120/73   Pulse 84   Temp (!) 97.4 F (36.3 C) (Temporal)   Ht 5' 2 (1.575 m)   Wt 195 lb (88.5 kg)   LMP 11/20/2023 (Approximate)   SpO2 99%   BMI 35.67 kg/m        Assessment & Plan:   Yolanda Villa in today with chief complaint of No chief complaint on file.   1. Anxiety and depression (Primary) Stress management Exercise Suicide hotline discussed- seek immediate help if has worsening thoughts Start Celexa  20mg  daily Meds ordered this encounter  Medications   citalopram  (CELEXA ) 20 MG tablet    Sig: Take 1 tablet (20 mg total) by mouth daily.    Dispense:  30 tablet    Refill:  3    Supervising Provider:   JOLINDA NORENE HERO [[id number]]       The above assessment and management plan was discussed with the patient. The patient verbalized understanding of and has agreed to the management plan. Patient is aware to call the clinic if symptoms persist or worsen. Patient is aware when to return to the clinic for a follow-up visit. Patient educated on when it is appropriate to go to the emergency department.   Mary-Margaret Gladis, FNP

## 2023-12-14 NOTE — Patient Instructions (Signed)
 Suicidal Feelings: How to Help Yourself Suicide is when you end your own life. Suicidal ideation includes expressing thoughts about, or a preoccupation with, ending your own life. There are many things you can do to help yourself feel better when struggling with these feelings. Many services and people are available to support you and others who struggle with similar feelings. If you ever feel like you may hurt yourself or others, or have thoughts about taking your own life, get help right away. To get help: Go to your nearest emergency room. Call 911. Call the [employer] health and human services helpline (211 in the [place] Call or text a suicide hotline to speak with a trained counselor. The following suicide hotlines are available in the [place]: The Suicide & Crisis Lifeline (free and confidential): Call (786)105-2207 or 988. Text (504)630-2655. 1-800-SUICIDE (765)353-7571). If you're a Veteran: Call 988 and press 1. Text the [facility] at (484) 775-2904. 601-773-2819. This is a hotline for Spanish speakers. (203)656-7651. This is a hotline for TTY users. 1-866-4-U-TREVOR (928) 225-5232). This is a hotline for lesbian, gay, bisexual, transgender, or questioning youth. For a list of hotlines in [place], visit AmenCredit.is.html Contact a crisis center or a local suicide prevention center. To find a crisis center or suicide prevention center: Call your local hospital, clinic, community service organization, mental health center, social service provider, or health department. Ask for help with connecting to a crisis center. For a list of crisis centers in the [place], visit: suicidepreventionlifeline.org For a list of crisis centers in [place], visit: suicideprevention.ca How to help yourself feel better  Promise yourself that you will not do anything bad or extreme when you have suicidal feelings. Remember the times you have felt  hopeful. Many people have gotten through suicidal thoughts and feelings, and you can too. If you have had these feelings before, remind yourself that you can get through them again. Let family, friends, teachers, or counselors know how you are feeling. Do not separate yourself from those who care about you and want to help you. Talk with someone every day, even if you do not feel like talking to anyone or being with other people. Face-to-face conversation is best to help them understand your feelings. Contact a mental health care provider and work with this person regularly. Make a safety plan that you can follow during a crisis. Include phone numbers of suicide prevention hotlines, mental health professionals, and trusted friends and family members you can call during an emergency. Save these numbers on your phone. If you are thinking of taking a lot of medicine, give your medicine to someone who can give it to you as prescribed. If you are on antidepressants and are concerned you will overdose, tell your health care provider so that he or she can give you safer medicines. Try to stick to your routines and follow a schedule every day. Make self-care a priority. Make a list of realistic goals, and cross them off when you achieve them. Accomplishments can give you a sense of worth. Wait until you are feeling better before doing things that you find difficult or unpleasant. Do things that you have always enjoyed to take your mind off your feelings. Try reading a book, or listening to or playing music. Spending time outside, in nature, may help you feel better. Follow these instructions at home:  Visit your primary health care provider every year for a physical and a mental health checkup. Take over-the-counter and prescription medicines only as  told by your health care provider. Ask your health care provider about the possible side effects of any medicines you are taking. Ask your health care  provider about whether suicidal ideation is a possible side effect of any of your medicines. Learn about suicidal ideation and what increases the risk for the development of suicidal thoughts. Eat a well-balanced diet, and eat regular meals. Get plenty of rest. Exercise if you are able. Just 30 minutes of exercise each day can help you feel better. Keep your living space well lit. Do not use alcohol or drugs. Remove these substances from your home. General recommendations Remove weapons, poisons, knives, and other deadly items from your home. Work with a mental health care provider as needed. When you are feeling well, write yourself a letter with tips and support that you can read when you are not feeling well. Remember that life's difficulties can be sorted out with help. Conditions can be treated, and you can learn behaviors and ways of thinking that will help you. Work with your health care provider or counselor to learn ways of coping with your thoughts and feelings. Where to find more information National Suicide Prevention Lifeline: www.suicidepreventionlifeline.org Hopeline: www.hopeline.com [employer] for Suicide Prevention: https://www.ayers.com/ [employer] (for lesbian, gay, bisexual, transgender, or questioning youth): www.thetrevorproject.[employer] of Mental Health: https://ramirez-williams.com/ Suicide Prevention Resources: FarmerBuys.com.au Contact a health care provider if: You feel as though you are a burden to others. You feel agitated, angry, vengeful, or have extreme mood swings. You have withdrawn from family and friends. You are frequently using drugs or alcohol. Get help right away if: You are talking about suicide or wishing to die. You start making plans for how to commit suicide. You feel that you have no reason to live. You start making plans for putting your affairs in order, saying goodbye, or giving  your possessions away. You feel guilt, shame, or unbearable pain, and it seems like there is no way out. You are engaging in risky behaviors that could lead to death. If you have any of these thoughts or symptoms, get help right away: Go to your nearest emergency department or crisis center. Call emergency services (911 in the [place] Call or text a suicide crisis helpline. Summary Suicide is when you take your own life. Suicidal feelings are thoughts about ending your own life. Promise yourself that you will not do anything bad or extreme when you have suicidal feelings. Let family, friends, teachers, or counselors know how you are feeling. Get help right away if you start making plans for how to commit suicide. This information is not intended to replace advice given to you by your health care provider. Make sure you discuss any questions you have with your health care provider. Document Revised: 10/10/2022 Document Reviewed: 04/29/2020 Elsevier Patient Education  2024 [employer].

## 2023-12-14 NOTE — Telephone Encounter (Signed)
 noted

## 2023-12-14 NOTE — Telephone Encounter (Signed)
 Please see NT encounter. Denies suicidal thoughts today , no plan. Called to request referral for psych dr and reported anxiety, depression, suicidal thoughts in the past.     FYI Only or Action Required?: FYI only for provider: appointment scheduled on 12/14/23.  Patient was last seen in primary care on 10/02/2023 by Deitra Morton Sebastian Nena, NP.  Called Nurse Triage reporting Depression. Anxiety , suicidal thoughts past couple of months Symptoms began several months ago.  Interventions attempted: Prescription medications: unknown, patient requesting to restart medications.  Symptoms are: gradually worsening.  Triage Disposition: See HCP Within 4 Hours (Or PCP Triage)  Patient/caregiver understands and will follow disposition?: Yes          Copied from CRM #[id number]. Topic: Clinical - Red Word Triage >> Dec 14, 2023 12:45 PM Alfonso ORN wrote: Red Word that prompted transfer to Nurse Triage: patient anxiety and depression is back and is severe ,having sucidical thought   Patient called to get a referral to see a psychiatrist , stated red word Reason for Disposition  [1] Patient is not threatening suicide now BUT [2] had SUICIDAL BEHAVIORS in past 3 months  Answer Assessment - Initial Assessment Questions Appt scheduled today with DOD. No available appt with PCP until Monday . Instructed to call back or text [phone] if suicidal thoughts occur prior to appt. Patient verbalized understanding. Patient wants referral to another psych dr.  Patient denies plan for suicide and denies suicidal thoughts today CAL, Anika, notified of appt today.      1. MAIN CONCERN: What happened that made you call today?     Requesting referral to another psych provider. Dr. Antonetta has moved to [facility]. Reports thoughts of suicide in the past couple of months 2. RISK OF HARM - SUICIDAL IDEATION:  Do you ever have thoughts of hurting or killing yourself?  (e.g., yes, no, no but preoccupation with  thoughts about death)     No thoughts of  harming self or others today  3. RISK OF HARM - SUICIDE ATTEMPT: Have you tried to harm yourself recently? If Yes, ask: When was this?  What type of harm was tried?     No  4. RISK OF HARM - SUICIDAL BEHAVIOR: Have you ever done anything, started to do anything, or prepared to do anything to end your life? (e.g., collected pills, bought a gun, wrote a suicide note, cut yourself, started but changed your mind)     Denies  5. EVENTS AND STRESSORS: Has there been any new stress or recent changes in your life? (e.g., death of loved one, homelessness, negative event, relationship breakup, work)     Relationship issues over the last couple of months  6. FUNCTIONAL IMPAIRMENT: How have things been going for you overall? Have you had more difficulty than usual doing your normal daily activities?  (e.g., better, same, worse; self-care, school, work, interactions)     Overall good seeing psych dr but now dr has moved to [place] 7. SUPPORT: Who is with you now? Who do you live with? Do you have family or friends who you can talk to?  No one with patient now but reports she doesn't talk to many people. Does talk with Family as needed. 8. THERAPIST: Do you have a counselor or therapist? If Yes, ask: What is their name?     Dr. Antonetta moved to [facility] 9. ALCOHOL USE OR SUBSTANCE USE (DRUG USE): Do you drink alcohol or use any illegal drugs (or prescription  drugs in ways other than prescribed)?     No  10. OTHER: Do you have any other physical symptoms right now? (e.g., fever)       Dizziness started today , getting angry easily , in a relationship that causes her to have worsening sx anxiety , depression and suicidal thoughts again.  Denies thoughts of hurting self now does not report a plan. 11. PREGNANCY or POSTPARTUM: Is there any chance you are pregnant? When was your last menstrual period? Were you recently pregnant? When did you  give birth?       No  Protocols used: Suicide Concerns-A-AH

## 2023-12-18 LAB — CYTOLOGY - PAP
Adequacy: ABSENT
Chlamydia: NEGATIVE
Comment: NEGATIVE
Comment: NEGATIVE
Comment: NEGATIVE
Comment: NEGATIVE
Comment: NORMAL
Diagnosis: UNDETERMINED — AB
HPV 16: NEGATIVE
HPV 18 / 45: NEGATIVE
High risk HPV: POSITIVE — AB
Neisseria Gonorrhea: NEGATIVE

## 2023-12-19 ENCOUNTER — Ambulatory Visit: Payer: Self-pay | Admitting: Adult Health

## 2023-12-19 DIAGNOSIS — R87610 Atypical squamous cells of undetermined significance on cytologic smear of cervix (ASC-US): Secondary | ICD-10-CM

## 2024-01-10 ENCOUNTER — Ambulatory Visit: Admitting: Family Medicine

## 2024-02-18 ENCOUNTER — Encounter: Admitting: Obstetrics & Gynecology
